# Patient Record
Sex: Female | Born: 1995 | Race: Black or African American | Hispanic: No | Marital: Single | Smoking: Current some day smoker
Health system: Southern US, Community
[De-identification: ages and names within clinical notes are randomized; demographics above are authoritative.]

## PROBLEM LIST (undated history)

## (undated) ENCOUNTER — Emergency Department (HOSPITAL_BASED_OUTPATIENT_CLINIC_OR_DEPARTMENT_OTHER): Admission: EM | Payer: Self-pay | Source: Home / Self Care

## (undated) DIAGNOSIS — J45909 Unspecified asthma, uncomplicated: Secondary | ICD-10-CM

---
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
## 2015-06-04 ENCOUNTER — Emergency Department (HOSPITAL_COMMUNITY)
Admission: EM | Admit: 2015-06-04 | Discharge: 2015-06-04 | Disposition: A | Discharge status: Home / Self Care | Payer: Medicaid Other | Attending: Emergency Medicine | Admitting: Emergency Medicine

## 2015-06-04 ENCOUNTER — Encounter (HOSPITAL_COMMUNITY): Payer: Self-pay | Admitting: General Practice

## 2015-06-04 DIAGNOSIS — M545 Low back pain: Secondary | ICD-10-CM | POA: Insufficient documentation

## 2015-06-04 DIAGNOSIS — Z3202 Encounter for pregnancy test, result negative: Secondary | ICD-10-CM | POA: Insufficient documentation

## 2015-06-04 LAB — COMPREHENSIVE METABOLIC PANEL
ALBUMIN: 3.6 g/dL (ref 3.5–5.0)
ALK PHOS: 60 U/L (ref 38–126)
ALT: 12 U/L — ABNORMAL LOW (ref 14–54)
AST: 19 U/L (ref 15–41)
Anion gap: 11 (ref 5–15)
BILIRUBIN TOTAL: 0.5 mg/dL (ref 0.3–1.2)
BUN: 7 mg/dL (ref 6–20)
CALCIUM: 9.3 mg/dL (ref 8.9–10.3)
CO2: 24 mmol/L (ref 22–32)
Chloride: 104 mmol/L (ref 101–111)
Creatinine, Ser: 0.69 mg/dL (ref 0.44–1.00)
GFR calc Af Amer: >60 mL/min (ref >60)
GFR calc non Af Amer: >60 mL/min (ref >60)
Glucose, Bld: 119 mg/dL — ABNORMAL HIGH (ref 65–99)
POTASSIUM: 3.6 mmol/L (ref 3.5–5.1)
Sodium: 139 mmol/L (ref 135–145)
TOTAL PROTEIN: 7.3 g/dL (ref 6.5–8.1)

## 2015-06-04 LAB — CBC WITH DIFFERENTIAL/PLATELET
BASOS ABS: 0 10*3/uL (ref 0.0–0.1)
BASOS PCT: 1 %
Eosinophils Absolute: 0 10*3/uL (ref 0.0–0.7)
Eosinophils Relative: 1 %
HEMATOCRIT: 37.5 % (ref 36.0–46.0)
HEMOGLOBIN: 12 g/dL (ref 12.0–15.0)
LYMPHS PCT: 42 %
Lymphs Abs: 1.8 10*3/uL (ref 0.7–4.0)
MCH: 25.2 pg — ABNORMAL LOW (ref 26.0–34.0)
MCHC: 32 g/dL (ref 30.0–36.0)
MCV: 78.8 fL (ref 78.0–100.0)
MONOS PCT: 8 %
Monocytes Absolute: 0.3 10*3/uL (ref 0.1–1.0)
NEUTROS ABS: 2 10*3/uL (ref 1.7–7.7)
NEUTROS PCT: 48 %
Platelets: 341 10*3/uL (ref 150–400)
RBC: 4.76 MIL/uL (ref 3.87–5.11)
RDW: 14.7 % (ref 11.5–15.5)
WBC: 4.2 10*3/uL (ref 4.0–10.5)

## 2015-06-04 LAB — URINALYSIS, ROUTINE W REFLEX MICROSCOPIC
Bilirubin Urine: NEGATIVE
Glucose, UA: NEGATIVE mg/dL
Ketones, ur: NEGATIVE mg/dL
Leukocytes, UA: NEGATIVE
Nitrite: NEGATIVE
Protein, ur: NEGATIVE mg/dL
Specific Gravity, Urine: 1.019 (ref 1.005–1.030)
Urobilinogen, UA: 0.2 mg/dL (ref 0.0–1.0)
pH: 5.5 (ref 5.0–8.0)

## 2015-06-04 LAB — POC URINE PREG, ED: Preg Test, Ur: NEGATIVE

## 2015-06-04 LAB — URINE MICROSCOPIC-ADD ON

## 2015-06-04 MED ORDER — METHOCARBAMOL 500 MG PO TABS: 500.0000 mg | ORAL_TABLET | Freq: Two times a day (BID) | ORAL | Status: DC

## 2015-06-04 MED ORDER — IBUPROFEN 800 MG PO TABS: 800.0000 mg | ORAL_TABLET | Freq: Three times a day (TID) | ORAL | Status: DC

## 2015-06-04 NOTE — ED Provider Notes (Signed)
CSN: [id number]     Arrival date & time 06/04/15  1003 History   First MD Initiated Contact with Patient 06/04/15 1008     Chief Complaint  Patient presents with  . Back Pain     (Consider location/radiation/quality/duration/timing/severity/associated sxs/prior Treatment) HPI   Caitlyn Jordan is a 19 y.o. female, pt with no pertinent past medical history, presents with pain in her lower right back and right flank, starting 2 months ago. Pt rates it 7/10, sharp in nature, occasionally moves to her left lower back.  Pt adds that her breasts are tender and she has had swelling in her legs bilaterally during this time.  Pt LMP is now, but has been intermittent. Reports foul smelling urine, but denies dysuria, frequency, or urgency. Denies changes in bowel movements. Denies fever/chills, N/V/C/D, previous episodes of this nature, hematochezia, or any other pain or complaints. Pt has not taken anything for the pain. Denies any injuries.       History reviewed. No pertinent past medical history. History reviewed. No pertinent past surgical history. No family history on file. Social History  Substance Use Topics  . Smoking status: Never Smoker   . Smokeless tobacco: None  . Alcohol Use: No   OB History    No data available     Review of Systems  Constitutional: Negative for fever, chills, diaphoresis and unexpected weight change.  Respiratory: Negative for cough, chest tightness and shortness of breath.   Cardiovascular: Negative for chest pain, palpitations and leg swelling.  Gastrointestinal: Negative for nausea, vomiting, abdominal pain, diarrhea and constipation.  Genitourinary: Negative for dysuria, frequency, hematuria, flank pain, vaginal bleeding, vaginal discharge, difficulty urinating, vaginal pain and pelvic pain.  Musculoskeletal: Positive for back pain. Negative for joint swelling and arthralgias.  Skin: Negative for color change and pallor.  Neurological: Negative for  dizziness, syncope, weakness, light-headedness, numbness and headaches.  All other systems reviewed and are negative.     Allergies  Review of patient's allergies indicates no known allergies.  Home Medications   Prior to Admission medications   Medication Sig Start Date End Date Taking? Authorizing Provider  ibuprofen (ADVIL,MOTRIN) 800 MG tablet Take 1 tablet (800 mg total) by mouth 3 (three) times daily. 06/04/15   Shawn C Joy, PA-C  methocarbamol (ROBAXIN) 500 MG tablet Take 1 tablet (500 mg total) by mouth 2 (two) times daily. 06/04/15   Shawn C Joy, PA-C   BP 110/42 mmHg  Pulse 71  Temp(Src) 98.5 F (36.9 C) (Oral)  Resp 18  Ht 5\' 3"  (1.6 m)  Wt 164 lb (74.39 kg)  BMI 29.06 kg/m2  SpO2 100%  LMP 06/03/2015 Physical Exam  Constitutional: She appears well-developed and well-nourished. No distress.  HENT:  Head: Normocephalic and atraumatic.  Eyes: Conjunctivae are normal. Pupils are equal, round, and reactive to light.  Cardiovascular: Normal rate, regular rhythm and normal heart sounds.   Pulmonary/Chest: Effort normal and breath sounds normal. No respiratory distress.  Abdominal: Soft. Bowel sounds are normal.  Mild CVA tenderness on right. Abdomen without tenderness or guarding.   Musculoskeletal: She exhibits no edema or tenderness.  Neurological: She is alert.  Skin: Skin is warm and dry. She is not diaphoretic.  Nursing note and vitals reviewed.   ED Course  Procedures (including critical care time) Labs Review Labs Reviewed  URINALYSIS, ROUTINE W REFLEX MICROSCOPIC (NOT AT [facility]) - Abnormal; Notable for the following:    Hgb urine dipstick SMALL (*)    All other  components within normal limits  URINE MICROSCOPIC-ADD ON - Abnormal; Notable for the following:    Bacteria, UA FEW (*)    All other components within normal limits  CBC WITH DIFFERENTIAL/PLATELET - Abnormal; Notable for the following:    MCH 25.2 (*)    All other components within normal limits   COMPREHENSIVE METABOLIC PANEL  POC URINE PREG, ED    Imaging Review No results found. I have personally reviewed and evaluated these images and lab results as part of my medical decision-making.   EKG Interpretation None      MDM   Final diagnoses:  Right low back pain, with sciatica presence unspecified    Caitlyn Jordan presents with lower back pain for the past two months.   UA results non-specific. Pregnancy test is negative. Pt is comfortably sitting up in bed, playing with her phone. Suspect MSK back pain. While waiting for lab results, pt confides in nurse that she really just wanted a pregnancy test and doesn't need anything else. Will discharge pt and have her follow up with PCP.   Anselm Pancoast, PA-C 06/04/15 1246  Blake Divine, MD 06/05/15 [phone]

## 2015-06-04 NOTE — Discharge Instructions (Signed)
You have been seen today for back pain. Your lab tests showed no abnormalities. Return to ED should symptoms worsen.  Take the prescribed medication and follow up with your PCP as needed.    Emergency Department Resource Guide 1) Find a Doctor and Pay Out of Pocket Although you won't have to find out who is covered by your insurance plan, it is a good idea to ask around and get recommendations. You will then need to call the office and see if the doctor you have chosen will accept you as a new patient and what types of options they offer for patients who are self-pay. Some doctors offer discounts or will set up payment plans for their patients who do not have insurance, but you will need to ask so you aren't surprised when you get to your appointment.  2) Contact Your Local Health Department Not all health departments have doctors that can see patients for sick visits, but many do, so it is worth a call to see if yours does. If you don't know where your local health department is, you can check in your phone book. The CDC also has a tool to help you locate your state's health department, and many state websites also have listings of all of their local health departments.  3) Find a Walk-in Clinic If your illness is not likely to be very severe or complicated, you may want to try a walk in clinic. These are popping up all over the country in pharmacies, drugstores, and shopping centers. They're usually staffed by nurse practitioners or physician assistants that have been trained to treat common illnesses and complaints. They're usually fairly quick and inexpensive. However, if you have serious medical issues or chronic medical problems, these are probably not your best option.  No Primary Care Doctor: - Call Health Connect at  [phone] - they can help you locate a primary care doctor that  accepts your insurance, provides certain services, etc. - Physician Referral Service- [phone]  Chronic  Pain Problems: Organization         Address  Phone   Notes  [facility]  [phone]) [phone] Patients need to be referred by their primary care doctor.   Medication Assistance: Organization         Address  Phone   Notes  [facility] [address] [place], [place] [address] [phone]) [phone] --Must be a resident of [facility] [place] -- Must have NO insurance coverage whatsoever (no Medicaid/ Medicare, etc.) -- The pt. MUST have a primary care doctor that directs their care regularly and follows them in the community   MedAssist  [phone]   [employer]  [phone]    Agencies that provide inexpensive medical care: Organization         Address  Phone   Notes  [facility]  [phone]   [facility]    [phone]) [phone]   [facility] [address] [place], [place] [address] [phone](336) [phone]   [facility] [address], [place] [phone]   [facility]anned Parenthood    [phone]   [facility]    [phone]) [phone]   [facility]  [address], [place] Phone:  [phone], Fax:  [phone] Hours of Operation:  9 am - 6 pm, M-F.  Also accepts Medicaid/Medicare and self-pay.  [facility]  [address], [place] Phone: [phone], Fax: [phone]. Hours of Operation:  8:30 am - 5:30 pm, M-F.  Also accepts Medicaid and self-pay.  [facility] [place] [address], [place] Phone: [phone]   [place], [place], [place] [phone], Ext. [phone] Mondays & Thursdays: 7-9 AM.  First 15 patients are seen on a first come, first serve basis.    [place] Providers:  Organization         Address  Phone   Notes  [facility] [address], [place] [phone] Also  accepts self-pay patients.  [facility] [address], [place]  [phone]   [place], [address], [place] [phone]   [facility] [address], [place] [phone]   Lucianne Lei [address], [place]   [phone] Only accepts [place] Access [place] patients after they have their name applied to their card.   Self-Pay (no insurance) in [facility]:  Organization         Address  Phone   Notes  Sickle Cell Patients, [facility] [place] [phone]   [facility] [place] [phone]   [facility] [place]  [place], [place], [place] [phone]   [facility]/Dr. Osei-Bonsu  [address] [place] or [address], [place] [phone] Phone number for both [place] and [place] locations is the same.  [facility] [address], [place] [phone]   [facility] [address], [place] or [address] [phone]   [facility] [address] [place] [phone], phone; [phone], fax Sees patients 1st and 3rd Saturday of every month.  Must not qualify for public or private insurance (i.e. Medicaid, Medicare, [place] Health Choice, Veterans' Benefits)  Household income should be no more than 200% of the poverty level The clinic cannot treat you if you are pregnant or think you are pregnant  Sexually transmitted diseases are not treated at the clinic.    Dental Care: Organization         Address  Phone  Notes  [facility] Department of [facility] [phone] Accepts children up to age 78 who are enrolled in [place] or [place]; pregnant  women with a Medicaid card; and children who have applied for Medicaid or [place] Health Choice, but were declined, whose parents can pay a reduced fee at time of service.  [facility] Department of [facility]  [address], [place] [phone] Accepts children up to age 58 who are enrolled in [place] or [place]; pregnant women with a Medicaid card; and children who have applied for Medicaid or [place] Health Choice, but were declined, whose parents can pay a reduced fee at time of service.  [facility] Access PROGRAM  [place] [phone] Patients are seen by appointment only. Walk-ins are not accepted. [place] will see patients 67 years of age and older. Monday - Tuesday (8am-5pm) Most Wednesdays (8:30-5pm) $30 per visit, cash only  [facility]  [address], [facility] [phone] Patients are seen by appointment only. Walk-ins are not accepted. [place] will see patients 29 years of age and older. One Wednesday Evening (Monthly: Volunteer Based).  $30 per visit, cash only  [place]  [phone] for adults; Children under age 108, call Graduate Pediatric Dentistry at [phone]. Children aged 52-14, please call [phone] to request a pediatric application.  Dental services are provided in all areas of dental care including fillings, crowns and bridges, complete and partial dentures, implants, gum treatment, root canals, and extractions. Preventive care is also provided. Treatment is provided to both adults and children. Patients are selected via a lottery and there is often a waiting list.   [facility] [address] [place]  [phone] [email or web address]   [facility] [address] [place], [place] [phone], Ext. [phone] Second and Fourth Thursday of each month, opens at 6:30 AM; Clinic ends at 9 AM.  Patients are  seen on a first-come first-served basis, and a limited number are seen during each clinic.   [facility]  [address] Hillard Danker [place], [place] [phone]   Eligibility Requirements You must have lived in [place], [place], or [place] counties for at least the last three months.   You cannot be eligible for state or federal sponsored [employer], including [employer], [place], or [employer].   You generally cannot be eligible for healthcare insurance through your employer.    How to apply: Eligibility screenings are held every Tuesday and Wednesday afternoon from 1:00 pm until 4:00 pm. You do not need an appointment for the interview!  [facility] [address], [place], [place]   [place]  [place] Department  [place]  [phone]    Behavioral Health Resources in the Community: Intensive Outpatient Programs Organization         Address  Phone  Notes  [place]. [address], [place], [place] [phone]   [facility] Outpatient [address], [place], [place]   ADS: Alcohol & Drug Svcs [address] [place], [place]   [place] [address]  [place], [place] or [phone]   Substance Abuse Resources Organization         Address  Phone  Notes  Alcohol and Drug Services  [phone]   [place]  [phone]   [facility] [place]   Chinita Pester  [phone]   Residential & Outpatient Substance Abuse Program  [phone]   Psychological Services Organization         Address  Phone  Notes  [facility] [place]  [place]  [phone]   [place] [address], [place] [phone] or [phone]    Mobile Crisis  Teams Organization         Address  Phone  Notes  [facility], Mobile Crisis Care Unit  [phone]   Assertive Psychotherapeutic Services  [address] [place], [place]   [place] Levels [address], [place] [phone]    Self-Help/Support Groups Organization         Address  Phone             Notes  [facility] of [place] - variety of support groups  [place] Call for more information  Narcotics Anonymous (NA), [facility] [address], [employer] [place]  2 meetings at this location   Special educational needs teacher         Address  Phone  Notes  [facility]AP Residential Treatment [place],    [place]  [phone]   [facility]  [address] [place] [address]17510, [place], [place]   [place], [place] [phone] Admissions: 8am-3pm M-F  Incentives Substance [place] [address]    [place], [place] [phone]   [facility] [address] [place], [place], [place]   [facility] [address],  [place], [place]   [facility]ight Programs - Intensive Outpatient [address], [place], [place]   [facility] ([place].) [place].,  [place], [place] [phone] or [phone]   [facility] Services (RTS) [address], [place], [place] Accepts Medicaid  [facility] [place] [address],  [place] [phone] Substance Abuse/Addiction Treatment   [facility] Organization         Address  Phone  Notes  [facility]  [phone]   Domenic Schwab, PhD [address] Arlis Porta [place], [place]   [phone] or [phone]   [place], [place] [phone]   [facility] [address] [place], [place] [phone]  Insurance/Medicaid/sponsorship through [facility] and Families [address]                                    [place], [place] [phone] [place] [address] [place], [place] [phone]    Dr. Adele Schilder  [phone]   [facility] Dept. 1) [address] [place] 2) [place] 3)  [place] [address], [place] [phone]  [phone]   [place] [phone] or [phone] (After Hours)

## 2015-06-04 NOTE — ED Notes (Signed)
Pt presents to the ED with complaints of right sided back pain that started two months ago. Pt reporting a malodorus smell to urine. Pt denies any vaginal discharge, burning or frequency of urination. Pt reporting some discharge and tenderness of her breast bilaterally.

## 2015-09-19 ENCOUNTER — Encounter (HOSPITAL_BASED_OUTPATIENT_CLINIC_OR_DEPARTMENT_OTHER): Payer: Self-pay | Admitting: Emergency Medicine

## 2015-09-19 ENCOUNTER — Emergency Department (HOSPITAL_BASED_OUTPATIENT_CLINIC_OR_DEPARTMENT_OTHER)
Admission: EM | Admit: 2015-09-19 | Discharge: 2015-09-20 | Disposition: A | Discharge status: Home / Self Care | Payer: Medicaid Other | Attending: Emergency Medicine | Admitting: Emergency Medicine

## 2015-09-19 DIAGNOSIS — Z791 Long term (current) use of non-steroidal anti-inflammatories (NSAID): Secondary | ICD-10-CM | POA: Insufficient documentation

## 2015-09-19 DIAGNOSIS — N926 Irregular menstruation, unspecified: Secondary | ICD-10-CM | POA: Insufficient documentation

## 2015-09-19 DIAGNOSIS — Z3202 Encounter for pregnancy test, result negative: Secondary | ICD-10-CM | POA: Insufficient documentation

## 2015-09-19 DIAGNOSIS — Z79899 Other long term (current) drug therapy: Secondary | ICD-10-CM | POA: Insufficient documentation

## 2015-09-19 DIAGNOSIS — N946 Dysmenorrhea, unspecified: Secondary | ICD-10-CM | POA: Insufficient documentation

## 2015-09-19 DIAGNOSIS — N910 Primary amenorrhea: Secondary | ICD-10-CM

## 2015-09-19 NOTE — ED Notes (Signed)
Vaginal bleeding with not pain until 1pm today. Pt states was supposed to start period on Feb 4th. She states she doesn't know if she is pregnant.

## 2015-09-19 NOTE — ED Provider Notes (Signed)
CSN: [id number]     Arrival date & time 09/19/15  2333 History  By signing my name below, I, Arianna Nassar, attest that this documentation has been prepared under the direction and in the presence of Laurence Spates, MD. Electronically Signed: Octavia Heir, ED Scribe. 09/19/2015. 11:57 PM.    Chief Complaint  Patient presents with  . Vaginal Bleeding      The history is provided by the patient. No language interpreter was used.   HPI Comments: Caitlyn Jordan is a 20 y.o. female who presents to the Emergency Department complaining of constant, gradual worsening heavy vaginal bleeding onset this morning with associated intermittent lower abdominal cramping that started around 1 pm. Pt was supposed to have her period exactly on the 4th of February. She states that her periods are typically very light but the vaginal bleeding she is having is heavier than normal. Pt is not on on birth control and is sexually active without using protection. She is unsure if she is pregnant. Last period was on January 4th. Denies fever, vomiting, diarrhea, dysuria, burning while urinating, cough, cold, fever, and vaginal discharge.  History reviewed. No pertinent past medical history. History reviewed. No pertinent past surgical history. No family history on file. Social History  Substance Use Topics  . Smoking status: Never Smoker   . Smokeless tobacco: None  . Alcohol Use: No   OB History    No data available     Review of Systems  10 Systems reviewed and are negative for acute change except as noted in the HPI.   Allergies  Review of patient's allergies indicates no known allergies.  Home Medications   Prior to Admission medications   Medication Sig Start Date End Date Taking? Authorizing Provider  ibuprofen (ADVIL,MOTRIN) 800 MG tablet Take 1 tablet (800 mg total) by mouth 3 (three) times daily. 06/04/15   Shawn C Joy, PA-C  methocarbamol (ROBAXIN) 500 MG tablet Take 1 tablet (500 mg  total) by mouth 2 (two) times daily. 06/04/15   Anselm Pancoast, PA-C   Triage vitals: BP 136/79 mmHg  Pulse 84  Temp(Src) 98.4 F (36.9 C) (Oral)  Resp 18  Ht  (1.6 m)  Wt 160 lb (72.576 kg)  BMI 28.35 kg/m2  SpO2 100% Physical Exam  Constitutional: She is oriented to person, place, and time. She appears well-developed and well-nourished. No distress.  HENT:  Head: Normocephalic and atraumatic.  Moist mucous membranes  Eyes: Conjunctivae are normal. Pupils are equal, round, and reactive to light.  Neck: Neck supple.  Cardiovascular: Normal rate, regular rhythm and normal heart sounds.   No murmur heard. Pulmonary/Chest: Effort normal and breath sounds normal.  Abdominal: Soft. Bowel sounds are normal. She exhibits no distension. There is tenderness. There is no rebound and no guarding.  Mild suprapubic tenderness to palpation  Genitourinary: Vagina normal.  Moderate amount of blood in vaginal vault, no cervical motion or adnexal tenderness, no suprapubic tenderness on bimanual exam  Musculoskeletal: She exhibits no edema.  Neurological: She is alert and oriented to person, place, and time.  Fluent speech  Skin: Skin is warm and dry.  Psychiatric: She has a normal mood and affect. Judgment normal.  Nursing note and vitals reviewed. Chaperone was present during exam.   ED Course  Procedures  DIAGNOSTIC STUDIES: Oxygen Saturation is 100% on RA, normal by my interpretation.  COORDINATION OF CARE:  11:55 PM Discussed treatment plan which includes pelvic exam and labs with pt  at bedside and pt agreed to plan.  Labs Review Labs Reviewed  COMPREHENSIVE METABOLIC PANEL - Abnormal; Notable for the following:    Glucose, Bld 112 (*)    ALT 13 (*)    All other components within normal limits  CBC WITH DIFFERENTIAL/PLATELET - Abnormal; Notable for the following:    MCH 25.4 (*)    Platelets 419 (*)    All other components within normal limits  HCG, QUANTITATIVE, PREGNANCY   ABO/RH     MDM   Final diagnoses:  Delayed menstruation  Menstrual cramps   Patient presents with vaginal bleeding several days after her expected date of menstrual period associated with suprapubic cramping that became worse today. Mild improvement with hot shower. Patient was well-appearing with normal vital signs on exam. Mild suprapubic tenderness on abdominal exam but no tenderness on bimanual exam. Obtained above lab work which shows negative beta hCG. Symptoms consistent with dysmenorrhea given that the patient has had no vomiting, diarrhea, fevers, or other complaints. Discussed supportive care and emphasized importance of follow-up at [facility] clinic for birth control options as patient is at high risk for unplanned pregnancy. Patient voiced understanding and was discharged in satisfactory condition.  I personally performed the services described in this documentation, which was scribed in my presence. The recorded information has been reviewed and is accurate.   Laurence Spates, MD 09/20/15 [phone]

## 2015-09-20 LAB — COMPREHENSIVE METABOLIC PANEL
ALBUMIN: 4.1 g/dL (ref 3.5–5.0)
ALK PHOS: 58 U/L (ref 38–126)
ALT: 13 U/L — ABNORMAL LOW (ref 14–54)
ANION GAP: 10 (ref 5–15)
AST: 17 U/L (ref 15–41)
BUN: 10 mg/dL (ref 6–20)
CALCIUM: 9 mg/dL (ref 8.9–10.3)
CHLORIDE: 105 mmol/L (ref 101–111)
CO2: 23 mmol/L (ref 22–32)
Creatinine, Ser: 0.71 mg/dL (ref 0.44–1.00)
GFR calc non Af Amer: >60 mL/min (ref >60)
GLUCOSE: 112 mg/dL — AB (ref 65–99)
POTASSIUM: 3.5 mmol/L (ref 3.5–5.1)
SODIUM: 138 mmol/L (ref 135–145)
Total Bilirubin: 0.5 mg/dL (ref 0.3–1.2)
Total Protein: 8 g/dL (ref 6.5–8.1)

## 2015-09-20 LAB — CBC WITH DIFFERENTIAL/PLATELET
BASOS PCT: 1 %
Basophils Absolute: 0 10*3/uL (ref 0.0–0.1)
Eosinophils Absolute: 0.1 10*3/uL (ref 0.0–0.7)
Eosinophils Relative: 2 %
HEMATOCRIT: 38.2 % (ref 36.0–46.0)
HEMOGLOBIN: 12.1 g/dL (ref 12.0–15.0)
LYMPHS PCT: 47 %
Lymphs Abs: 3.3 10*3/uL (ref 0.7–4.0)
MCH: 25.4 pg — ABNORMAL LOW (ref 26.0–34.0)
MCHC: 31.7 g/dL (ref 30.0–36.0)
MCV: 80.3 fL (ref 78.0–100.0)
MONO ABS: 0.5 10*3/uL (ref 0.1–1.0)
MONOS PCT: 7 %
NEUTROS ABS: 2.9 10*3/uL (ref 1.7–7.7)
NEUTROS PCT: 43 %
Platelets: 419 10*3/uL — ABNORMAL HIGH (ref 150–400)
RBC: 4.76 MIL/uL (ref 3.87–5.11)
RDW: 14.3 % (ref 11.5–15.5)
WBC: 6.9 10*3/uL (ref 4.0–10.5)

## 2015-09-20 LAB — HCG, QUANTITATIVE, PREGNANCY: hCG, Beta Chain, Quant, S: <1 m[IU]/mL (ref <5)

## 2015-09-20 LAB — ABO/RH: ABO/RH(D): A POS

## 2015-09-20 NOTE — Discharge Instructions (Signed)

## 2015-09-22 LAB — GC/CHLAMYDIA PROBE AMP (~~LOC~~) NOT AT ARMC
Chlamydia: NEGATIVE
Neisseria Gonorrhea: NEGATIVE

## 2016-08-30 ENCOUNTER — Emergency Department (HOSPITAL_BASED_OUTPATIENT_CLINIC_OR_DEPARTMENT_OTHER)
Admission: EM | Admit: 2016-08-30 | Discharge: 2016-08-30 | Disposition: A | Discharge status: Home / Self Care | Payer: Self-pay | Attending: Emergency Medicine | Admitting: Emergency Medicine

## 2016-08-30 ENCOUNTER — Encounter (HOSPITAL_BASED_OUTPATIENT_CLINIC_OR_DEPARTMENT_OTHER): Payer: Self-pay | Admitting: *Deleted

## 2016-08-30 DIAGNOSIS — R110 Nausea: Secondary | ICD-10-CM | POA: Insufficient documentation

## 2016-08-30 DIAGNOSIS — N926 Irregular menstruation, unspecified: Secondary | ICD-10-CM

## 2016-08-30 DIAGNOSIS — N912 Amenorrhea, unspecified: Secondary | ICD-10-CM | POA: Insufficient documentation

## 2016-08-30 LAB — PREGNANCY, URINE: Preg Test, Ur: NEGATIVE

## 2016-08-30 NOTE — ED Triage Notes (Signed)
Pt states that she had 2 positive and 1 negative pregnancy test and is here to confirm if she is pregnant. Denies any pain or complaints. Only here to confirm a pregnancy.

## 2016-08-30 NOTE — ED Provider Notes (Signed)
TIME SEEN: 6:25 AM  CHIEF COMPLAINT: "I'm here for pregnancy test"  HPI: Patient is a 3 female with no significant past pedicle history who has never been pregnant who presents to the emergency department requesting a pregnancy test. States her last menstrual period was the beginning of December. She reports she has not had a period in January. States that she took a pregnancy test at home that was positive and then another test that was negative. States she is here for confirmation. Denies fevers, chills, vaginal bleeding or discharge, abdominal pain, dysuria or hematuria. Has had nausea but no vomiting. No diarrhea. Sexually active with one female partner. States she has had some bilateral breast tenderness without nipple discharge.  ROS: See HPI Constitutional: no fever  Eyes: no drainage  ENT: no runny nose   Cardiovascular:  no chest pain  Resp: no SOB  GI: no vomiting GU: no dysuria Integumentary: no rash  Allergy: no hives  Musculoskeletal: no leg swelling  Neurological: no slurred speech ROS otherwise negative  PAST MEDICAL HISTORY/PAST SURGICAL HISTORY:  No past medical history on file.  MEDICATIONS:  Prior to Admission medications   Medication Sig Start Date End Date Taking? Authorizing Provider  ibuprofen (ADVIL,MOTRIN) 800 MG tablet Take 1 tablet (800 mg total) by mouth 3 (three) times daily. 06/04/15   Shawn C Joy, PA-C  methocarbamol (ROBAXIN) 500 MG tablet Take 1 tablet (500 mg total) by mouth 2 (two) times daily. 06/04/15   Shawn C Joy, PA-C    ALLERGIES:  No Known Allergies  SOCIAL HISTORY:  Social History  Substance Use Topics  . Smoking status: Never Smoker  . Smokeless tobacco: Not on file  . Alcohol use No    FAMILY HISTORY: No family history on file.  EXAM: BP 137/81 (BP Location: Right Arm)   Pulse 92   Temp 98.5 F (36.9 C) (Oral)   Resp 16   SpO2 100%  CONSTITUTIONAL: Alert and oriented and responds appropriately to questions. Well-appearing;  well-nourished, Smiling, looks well-hydrated, afebrile, nontoxic, in no distress HEAD: Normocephalic EYES: Conjunctivae clear, PERRL, EOMI ENT: normal nose; no rhinorrhea; moist mucous membranes NECK: Supple, no meningismus, no nuchal rigidity, no LAD  CARD: RRR; S1 and S2 appreciated; no murmurs, no clicks, no rubs, no gallops RESP: Normal chest excursion without splinting or tachypnea; breath sounds clear and equal bilaterally; no wheezes, no rhonchi, no rales, no hypoxia or respiratory distress, speaking full sentences ABD/GI: Normal bowel sounds; non-distended; soft, non-tender, no rebound, no guarding, no peritoneal signs, no hepatosplenomegaly BACK:  The back appears normal and is non-tender to palpation, there is no CVA tenderness EXT: Normal ROM in all joints; non-tender to palpation; no edema; normal capillary refill; no cyanosis, no calf tenderness or swelling    SKIN: Normal color for age and race; warm; no rash NEURO: Moves all extremities equally, ambulates with normal gait, normal speech PSYCH: The patient's mood and manner are appropriate. Grooming and personal hygiene are appropriate.  MEDICAL DECISION MAKING: Patient here requesting pregnancy test. No emergent complaints. Exam benign. Urine pregnancy test is negative here. I have advised her that if she does not start her menstrual cycle in one week she should recheck a pregnancy test at home. Discussed return precautions with patient. Given her outpatient PCP and OB/GYN follow-up. She is comfortable with this plan.   At this time, I do not feel there is any life-threatening condition present. I have reviewed and discussed all results (EKG, imaging, lab, urine as appropriate)  and exam findings with patient/family. I have reviewed nursing notes and appropriate previous records.  I feel the patient is safe to be discharged home without further emergent workup and can continue workup as an outpatient as needed. Discussed usual and  customary return precautions. Patient/family verbalize understanding and are comfortable with this plan.  Outpatient follow-up has been provided. All questions have been answered.        Layla MawKristen N Ward, DO 08/30/16 0710

## 2016-08-30 NOTE — Discharge Instructions (Addendum)
I recommend you take Tylenol 1000 mg every 6 hours as needed for pain. Please avoid NSAIDs such as ibuprofen, aspirin, Aleve. I recommend you start prenatal vitamins.  Please recheck a pregnancy test in one week.  Your test here was negative but this could mean you are not pregnant but also could be too early to detect pregnancy.    [facility] [email or web address] [address] [place], [place] [phone]    [facility] [email or web address] [address] [place], [place] [phone]    [facility] [address] [place], [place] [phone]   [facility] [email or web address] [address] [place], [place] [phone]   [facility] [email or web address] [address] [place], [place] [phone]   [email or web address] [address] [place], [place] [phone]     To find a primary care or specialty doctor please call [phone] or [phone] to access "[facility] Find a Doctor Service."  You may also go on the [facility] website at [email or web address]  There are also multiple [facility], [facility], [facility] and [facility] practices throughout [place] that are frequently accepting new patients. You may find a clinic that is close to your home and contact them.  [facility] -  [address] [place] [phone]   [facility] -  [address] [place] [address] [phone]   [facility] [phone]  [place] [phone]

## 2016-10-28 ENCOUNTER — Emergency Department (HOSPITAL_BASED_OUTPATIENT_CLINIC_OR_DEPARTMENT_OTHER): Payer: Self-pay

## 2016-10-28 ENCOUNTER — Encounter (HOSPITAL_BASED_OUTPATIENT_CLINIC_OR_DEPARTMENT_OTHER): Payer: Self-pay | Admitting: Emergency Medicine

## 2016-10-28 ENCOUNTER — Emergency Department (HOSPITAL_BASED_OUTPATIENT_CLINIC_OR_DEPARTMENT_OTHER)
Admission: EM | Admit: 2016-10-28 | Discharge: 2016-10-28 | Disposition: A | Discharge status: Home / Self Care | Payer: Self-pay | Attending: Emergency Medicine | Admitting: Emergency Medicine

## 2016-10-28 DIAGNOSIS — J069 Acute upper respiratory infection, unspecified: Secondary | ICD-10-CM

## 2016-10-28 DIAGNOSIS — B9789 Other viral agents as the cause of diseases classified elsewhere: Secondary | ICD-10-CM

## 2016-10-28 DIAGNOSIS — J40 Bronchitis, not specified as acute or chronic: Secondary | ICD-10-CM

## 2016-10-28 MED ORDER — BENZONATATE 100 MG PO CAPS: 100.0000 mg | ORAL_CAPSULE | Freq: Three times a day (TID) | ORAL | 0 refills | Status: AC | PRN

## 2016-10-28 MED ORDER — BENZONATATE 100 MG PO CAPS
200.0000 mg | ORAL_CAPSULE | Freq: Once | ORAL | Status: AC
  Administered 2016-10-28: 200 mg via ORAL
  Filled 2016-10-28: qty 2

## 2016-10-28 MED ORDER — IBUPROFEN 400 MG PO TABS
600.0000 mg | ORAL_TABLET | Freq: Once | ORAL | Status: AC
  Administered 2016-10-28: 600 mg via ORAL
  Filled 2016-10-28: qty 1

## 2016-10-28 NOTE — ED Provider Notes (Signed)
[facility] Provider Note   CSN: [id number] Arrival date & time: 10/28/16  0228     History   Chief Complaint Chief Complaint  Patient presents with  . Cough    HPI Caitlyn Jordan is a 21 y.o. female no sig PMH, here with persistent cough x 1 month.  She states she also has congestion and rhinorrhea and she seems to be coughing this up. Her symptoms are worse at night. She denies fevers or sick contacts. She is concerned she may have a bronchitis. She has not tried anything at home to help with her symptoms. There are no further complaints.  10 Systems reviewed and are negative for acute change except as noted in the HPI.    HPI  History reviewed. No pertinent past medical history.  There are no active problems to display for this patient.   History reviewed. No pertinent surgical history.  OB History    No data available       Home Medications    Prior to Admission medications   Medication Sig Start Date End Date Taking? Authorizing Provider  benzonatate (TESSALON) 100 MG capsule Take 1 capsule (100 mg total) by mouth 3 (three) times daily as needed for cough. 10/28/16   Tomasita CrumbleAdeleke Kayleann Mccaffery, MD  ibuprofen (ADVIL,MOTRIN) 800 MG tablet Take 1 tablet (800 mg total) by mouth 3 (three) times daily. 06/04/15   Shawn C Joy, PA-C  methocarbamol (ROBAXIN) 500 MG tablet Take 1 tablet (500 mg total) by mouth 2 (two) times daily. 06/04/15   Anselm PancoastShawn C Joy, PA-C    Family History No family history on file.  Social History Social History  Substance Use Topics  . Smoking status: Never Smoker  . Smokeless tobacco: Never Used  . Alcohol use No     Allergies   Patient has no known allergies.   Review of Systems Review of Systems   Physical Exam Updated Vital Signs BP 117/84 (BP Location: Right Arm)   Pulse (!) 108   Temp 98.9 F (37.2 C) (Oral)   Resp 20   Ht 5\' 2"  (1.575 m)   Wt 160 lb (72.6 kg)   LMP 10/06/2016 (Exact Date)   SpO2 100%   BMI 29.26 kg/m    Physical Exam  Constitutional: She is oriented to person, place, and time. She appears well-developed and well-nourished. No distress.  HENT:  Head: Normocephalic and atraumatic.  Nose: Nose normal.  Mouth/Throat: Oropharynx is clear and moist. No oropharyngeal exudate.  Eyes: Conjunctivae and EOM are normal. Pupils are equal, round, and reactive to light. No scleral icterus.  Neck: Normal range of motion. Neck supple. No JVD present. No tracheal deviation present. No thyromegaly present.  Cardiovascular: Normal rate, regular rhythm and normal heart sounds.  Exam reveals no gallop and no friction rub.   No murmur heard. Pulmonary/Chest: Effort normal and breath sounds normal. No respiratory distress. She has no wheezes. She exhibits no tenderness.  Abdominal: Soft. Bowel sounds are normal. She exhibits no distension and no mass. There is no tenderness. There is no rebound and no guarding.  Musculoskeletal: Normal range of motion. She exhibits no edema or tenderness.  Lymphadenopathy:    She has no cervical adenopathy.  Neurological: She is alert and oriented to person, place, and time. No cranial nerve deficit. She exhibits normal muscle tone.  Skin: Skin is warm and dry. No rash noted. No erythema. No pallor.  Nursing note and vitals reviewed.    ED Treatments /  Results  Labs (all labs ordered are listed, but only abnormal results are displayed) Labs Reviewed - No data to display  EKG  EKG Interpretation None       Radiology Dg Chest 2 View  Result Date: 10/28/2016 CLINICAL DATA:  21 year old female with productive cough. EXAM: CHEST  2 VIEW COMPARISON:  Chest radiograph dated 07/12/2011 FINDINGS: The heart size and mediastinal contours are within normal limits. Both lungs are clear. The visualized skeletal structures are unremarkable. IMPRESSION: No active cardiopulmonary disease. Electronically Signed   By: Elgie Collard M.D.   On: 10/28/2016 03:02     Procedures Procedures (including critical care time)  Medications Ordered in ED Medications  ibuprofen (ADVIL,MOTRIN) tablet 600 mg (600 mg Oral Given 10/28/16 0246)  benzonatate (TESSALON) capsule 200 mg (200 mg Oral Given 10/28/16 0246)     Initial Impression / Assessment and Plan / ED Course  I have reviewed the triage vital signs and the nursing notes.  Pertinent labs & imaging results that were available during my care of the patient were reviewed by me and considered in my medical decision making (see chart for details).     Patient presents to the emergency department for persistent cough. Chest x-rays negative for pneumonia. She was given ibuprofen and Tessalon which did relieve her cough although she states it made her slightly lightheaded. Vital signs remained stable, tachycardia resolved without any acute intervention. She'll be given Tessalon to take at home as needed. Also encouraged her to try home remedies for such as hot tea, limit, honey. She wishes good understanding, her primary care follow-up advised in 3 days. She appears well in acute distress, vital signs were within her normal limits and she is safe for discharge.     Final Clinical Impressions(s) / ED Diagnoses   Final diagnoses:  Bronchitis  Viral URI with cough    New Prescriptions New Prescriptions   BENZONATATE (TESSALON) 100 MG CAPSULE    Take 1 capsule (100 mg total) by mouth 3 (three) times daily as needed for cough.     Tomasita Crumble, MD 10/28/16 [phone]

## 2016-10-28 NOTE — ED Notes (Signed)
ED Provider at bedside. 

## 2016-10-28 NOTE — ED Triage Notes (Signed)
Cough x1 month.  Sputum green.  Pt sts cough is worse at night.

## 2017-05-05 ENCOUNTER — Emergency Department (HOSPITAL_BASED_OUTPATIENT_CLINIC_OR_DEPARTMENT_OTHER)
Admission: EM | Admit: 2017-05-05 | Discharge: 2017-05-05 | Disposition: A | Discharge status: Home / Self Care | Payer: No Typology Code available for payment source | Attending: Emergency Medicine | Admitting: Emergency Medicine

## 2017-05-05 ENCOUNTER — Emergency Department (HOSPITAL_BASED_OUTPATIENT_CLINIC_OR_DEPARTMENT_OTHER): Payer: No Typology Code available for payment source

## 2017-05-05 ENCOUNTER — Encounter (HOSPITAL_BASED_OUTPATIENT_CLINIC_OR_DEPARTMENT_OTHER): Payer: Self-pay | Admitting: *Deleted

## 2017-05-05 DIAGNOSIS — Y929 Unspecified place or not applicable: Secondary | ICD-10-CM | POA: Diagnosis not present

## 2017-05-05 DIAGNOSIS — Y939 Activity, unspecified: Secondary | ICD-10-CM | POA: Insufficient documentation

## 2017-05-05 DIAGNOSIS — Y999 Unspecified external cause status: Secondary | ICD-10-CM | POA: Diagnosis not present

## 2017-05-05 DIAGNOSIS — Z79899 Other long term (current) drug therapy: Secondary | ICD-10-CM | POA: Insufficient documentation

## 2017-05-05 DIAGNOSIS — S20212A Contusion of left front wall of thorax, initial encounter: Secondary | ICD-10-CM | POA: Diagnosis not present

## 2017-05-05 DIAGNOSIS — M25512 Pain in left shoulder: Secondary | ICD-10-CM | POA: Diagnosis present

## 2017-05-05 MED ORDER — ACETAMINOPHEN 500 MG PO TABS
1000.0000 mg | ORAL_TABLET | Freq: Once | ORAL | Status: AC
Start: 2017-05-05 — End: 2017-05-05
  Administered 2017-05-05: 1000 mg via ORAL
  Filled 2017-05-05: qty 2

## 2017-05-05 MED ORDER — ACETAMINOPHEN 500 MG PO TABS: 1000.0000 mg | ORAL_TABLET | Freq: Three times a day (TID) | ORAL | 0 refills | Status: AC
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
## 2017-12-06 ENCOUNTER — Emergency Department (HOSPITAL_BASED_OUTPATIENT_CLINIC_OR_DEPARTMENT_OTHER)
Admission: EM | Admit: 2017-12-06 | Discharge: 2017-12-06 | Disposition: A | Discharge status: Home / Self Care | Payer: Self-pay | Attending: Emergency Medicine | Admitting: Emergency Medicine

## 2017-12-06 ENCOUNTER — Encounter (HOSPITAL_BASED_OUTPATIENT_CLINIC_OR_DEPARTMENT_OTHER): Payer: Self-pay

## 2017-12-06 ENCOUNTER — Other Ambulatory Visit: Payer: Self-pay

## 2017-12-06 ENCOUNTER — Encounter (HOSPITAL_BASED_OUTPATIENT_CLINIC_OR_DEPARTMENT_OTHER): Payer: Self-pay | Admitting: Emergency Medicine

## 2017-12-06 DIAGNOSIS — Z79899 Other long term (current) drug therapy: Secondary | ICD-10-CM | POA: Insufficient documentation

## 2017-12-06 DIAGNOSIS — Z5321 Procedure and treatment not carried out due to patient leaving prior to being seen by health care provider: Secondary | ICD-10-CM | POA: Insufficient documentation

## 2017-12-06 DIAGNOSIS — M654 Radial styloid tenosynovitis [de Quervain]: Secondary | ICD-10-CM | POA: Insufficient documentation

## 2017-12-06 DIAGNOSIS — R2232 Localized swelling, mass and lump, left upper limb: Secondary | ICD-10-CM | POA: Insufficient documentation

## 2017-12-06 MED ORDER — NAPROXEN 375 MG PO TABS: 375.0000 mg | ORAL_TABLET | Freq: Two times a day (BID) | ORAL | 0 refills | Status: AC

## 2017-12-06 NOTE — Discharge Instructions (Signed)
Use splint throughout day.  Follow attached handout.  Take naproxen as prescribed with food.  Follow up with sports medicine as needed.  If you develop worsening or new concerning symptoms you can return to the emergency department for re-evaluation.

## 2017-12-06 NOTE — ED Triage Notes (Signed)
C/o swelling to left hand x today-states she had to leave work-denies injury-no swelling noted at this time-pt states "well it might have gone down some now"-NAD-steady gait

## 2017-12-06 NOTE — ED Triage Notes (Signed)
Pain in left thumb radiating up left forearm since yesterday.

## 2017-12-06 NOTE — ED Notes (Signed)
Pt verbalizes understanding of d/c instructions and denies any further needs at this time. 

## 2017-12-06 NOTE — ED Provider Notes (Signed)
[facility] EMERGENCY DEPARTMENT Provider Note   CSN: [id number] Arrival date & time: 12/06/17  1443     History   Chief Complaint Chief Complaint  Patient presents with  . Hand Pain    HPI Storie AVIS Caitlyn Jordan is a 22 y.o. right handed female no significant past medical history presents emergency department today for left hand/wrist pain.  Patient notes that since last night she has been having pain on the radial aspect of her left wrist that radiates into the base of her left thumb.  She notes this is worse after periods of using her thumb such as when texting.  She reports that she uses her hands quite frequently for work.  She notes that she has been taking ibuprofen and using ice for symptoms with mild relief.  The patient denies any fever, chills, joint swelling, overlying erythema, difficulty with range of motion of the joint, numbness, weakness or tingling.  HPI  History reviewed. No pertinent past medical history.  There are no active problems to display for this patient.   History reviewed. No pertinent surgical history.   OB History   None      Home Medications    Prior to Admission medications   Medication Sig Start Date End Date Taking? Authorizing Provider  benzonatate (TESSALON) 100 MG capsule Take 1 capsule (100 mg total) by mouth 3 (three) times daily as needed for cough. 10/28/16   Tomasita Crumble, MD  ibuprofen (ADVIL,MOTRIN) 800 MG tablet Take 1 tablet (800 mg total) by mouth 3 (three) times daily. 06/04/15   Joy, Shawn C, PA-C  methocarbamol (ROBAXIN) 500 MG tablet Take 1 tablet (500 mg total) by mouth 2 (two) times daily. 06/04/15   Joy, Hillard Danker, PA-C    Family History No family history on file.  Social History Social History   Tobacco Use  . Smoking status: Never Smoker  . Smokeless tobacco: Never Used  Substance Use Topics  . Alcohol use: No  . Drug use: No     Allergies   Patient has no known allergies.   Review of  Systems Review of Systems  All other systems reviewed and are negative.    Physical Exam Updated Vital Signs BP 119/68 (BP Location: Left Arm)   Pulse 73   Temp 98.8 F (37.1 C) (Oral)   Resp 16   Ht  (1.6 m)   Wt 88.5 kg (195 lb)   LMP 11/14/2017   SpO2 100%   BMI 34.54 kg/m   Physical Exam  Constitutional: She appears well-developed and well-nourished.  HENT:  Head: Normocephalic and atraumatic.  Right Ear: External ear normal.  Left Ear: External ear normal.  Eyes: Conjunctivae are normal. Right eye exhibits no discharge. Left eye exhibits no discharge. No scleral icterus.  Cardiovascular:  Pulses:      Radial pulses are 2+ on the right side, and 2+ on the left side.  Pulmonary/Chest: Effort normal. No respiratory distress.  Musculoskeletal:  Left hand: No gross deformities, skin intact.  No joint swelling, overlying erythema or heat.  Fingers appear normal. TTP over distal . Finger adduction/abduction intact with 5/5 strength.  Thumb opposition intact. Full active and resisted ROM to flexion/extension at wrist, MCP, PIP and DIP of all fingers.  FDS/FDP intact. Radial artery 2+ with <2sec cap refill. SILT in M/U/R distributions. Positive  Finkelstein's test.  Neurological: She is alert. She has normal strength. No sensory deficit.  Skin: Skin is warm and dry. Capillary refill  takes less than 2 seconds. No erythema. No pallor.  Psychiatric: She has a normal mood and affect.  Nursing note and vitals reviewed.    ED Treatments / Results  Labs (all labs ordered are listed, but only abnormal results are displayed) Labs Reviewed - No data to display  EKG None  Radiology No results found.  Procedures Procedures (including critical care time)  Medications Ordered in ED Medications - No data to display   Initial Impression / Assessment and Plan / ED Course  I have reviewed the triage vital signs and the nursing notes.  Pertinent labs & imaging results that  were available during my care of the patient were reviewed by me and considered in my medical decision making (see chart for details).     22 y.o. female with [name] Tenosynovitis based on history an exam. No history of trauma that would require xray at this time. Patient without fever, overlying redness/heat, joint swelling, or decreased rom that would make me concerned for septic joint. Patient placed in thumb spica.  Will treat with rest and conservative therapy.  Will provide referral to sports medicine, Shane Hudnall. Specific return precautions discussed. Time was given for all questions to be answered. The patient verbalized understanding and agreement with plan. The patient appears safe for discharge home.  Final Clinical Impressions(s) / ED Diagnoses   Final diagnoses:  De Quervain's disease (tenosynovitis)    ED Discharge Orders        Ordered    naproxen (NAPROSYN) 375 MG tablet  2 times daily     12/06/17 1517       Princella Pellegrini 12/06/17 1518    Alvira Monday, MD 12/08/17 1323

## 2018-06-13 ENCOUNTER — Encounter (HOSPITAL_BASED_OUTPATIENT_CLINIC_OR_DEPARTMENT_OTHER): Payer: Self-pay | Admitting: *Deleted

## 2018-06-13 ENCOUNTER — Emergency Department (HOSPITAL_BASED_OUTPATIENT_CLINIC_OR_DEPARTMENT_OTHER)
Admission: EM | Admit: 2018-06-13 | Discharge: 2018-06-13 | Disposition: A | Discharge status: Home / Self Care | Payer: Self-pay | Attending: Emergency Medicine | Admitting: Emergency Medicine

## 2018-06-13 ENCOUNTER — Emergency Department (HOSPITAL_BASED_OUTPATIENT_CLINIC_OR_DEPARTMENT_OTHER): Payer: Self-pay

## 2018-06-13 ENCOUNTER — Other Ambulatory Visit: Payer: Self-pay

## 2018-06-13 DIAGNOSIS — R51 Headache: Secondary | ICD-10-CM | POA: Insufficient documentation

## 2018-06-13 DIAGNOSIS — R519 Headache, unspecified: Secondary | ICD-10-CM

## 2018-06-13 DIAGNOSIS — Z79899 Other long term (current) drug therapy: Secondary | ICD-10-CM | POA: Insufficient documentation

## 2018-06-13 MED ORDER — DEXAMETHASONE SODIUM PHOSPHATE 10 MG/ML IJ SOLN
10.0000 mg | Freq: Once | INTRAMUSCULAR | Status: AC
  Administered 2018-06-13: 10 mg via INTRAVENOUS
  Filled 2018-06-13: qty 1

## 2018-06-13 MED ORDER — METOCLOPRAMIDE HCL 5 MG/ML IJ SOLN
10.0000 mg | Freq: Once | INTRAMUSCULAR | Status: AC
  Administered 2018-06-13: 10 mg via INTRAVENOUS
  Filled 2018-06-13: qty 2

## 2018-06-13 MED ORDER — SODIUM CHLORIDE 0.9 % IV BOLUS
1000.0000 mL | Freq: Once | INTRAVENOUS | Status: AC
  Administered 2018-06-13: 1000 mL via INTRAVENOUS

## 2018-06-13 MED ORDER — KETOROLAC TROMETHAMINE 30 MG/ML IJ SOLN
30.0000 mg | Freq: Once | INTRAMUSCULAR | Status: AC
  Administered 2018-06-13: 30 mg via INTRAMUSCULAR
  Filled 2018-06-13: qty 1

## 2018-06-13 NOTE — Discharge Instructions (Signed)
Please establish care with primary care provider by calling one of the number circled below.  If your headaches are continuing, I recommend you see a neurologist for further evaluation.  I also recommend getting your eyes checked at an optometrist, as eyestrain can cause you to have headaches.  Make sure to stay hydrated and eat regularly.  Please return the emergency department if you develop any new or worsening symptoms.

## 2018-06-13 NOTE — ED Triage Notes (Signed)
Headache x 2 weeks. Back pain.

## 2018-06-13 NOTE — ED Provider Notes (Signed)
[facility] EMERGENCY DEPARTMENT Provider Note   CSN: [id number] Arrival date & time: 06/13/18  1521     History   Chief Complaint Chief Complaint  Patient presents with  . Migraine    HPI Caitlyn Jordan is a 22 y.o. female who is previously healthy who presents with a 2-week history of headache.  She reports her headache started mild and has progressed gradually.  She rates her pain is an 11 out of 10.  She describes it as severe and sharp and throbbing.  She reports sometimes it is in the front of her head behind her eyes and sometimes in the back of her neck.  She denies any fevers.  She has had associated photophobia.  She denies any nausea, vomiting, abdominal pain.  She also reports some right-sided low back pain for the past couple days.  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  She used a heating pad for this.  She has been taking ibuprofen and Tylenol for her headache without relief.  She reports that she has never had a headache like this before.  She reports she occasionally get a mild headache for an hour and goes away.  She is never had a headache this severe or for this duration.  She denies any head trauma.  HPI  History reviewed. No pertinent past medical history.  There are no active problems to display for this patient.   History reviewed. No pertinent surgical history.   OB History   None      Home Medications    Prior to Admission medications   Medication Sig Start Date End Date Taking? Authorizing Provider  benzonatate (TESSALON) 100 MG capsule Take 1 capsule (100 mg total) by mouth 3 (three) times daily as needed for cough. 10/28/16   Tomasita Crumble, MD  ibuprofen (ADVIL,MOTRIN) 800 MG tablet Take 1 tablet (800 mg total) by mouth 3 (three) times daily. 06/04/15   Joy, Shawn C, PA-C  methocarbamol (ROBAXIN) 500 MG tablet Take 1 tablet (500 mg total) by mouth 2 (two) times daily. 06/04/15   Joy, Shawn C, PA-C  naproxen (NAPROSYN) 375 MG tablet Take 1  tablet (375 mg total) by mouth 2 (two) times daily. 12/06/17   Maczis, Elmer Sow, PA-C    Family History No family history on file.  Social History Social History   Tobacco Use  . Smoking status: Never Smoker  . Smokeless tobacco: Never Used  Substance Use Topics  . Alcohol use: No  . Drug use: No     Allergies   Patient has no known allergies.   Review of Systems Review of Systems  Constitutional: Negative for chills and fever.  HENT: Negative for facial swelling and sore throat.   Eyes: Positive for photophobia. Negative for visual disturbance.  Respiratory: Negative for shortness of breath.   Cardiovascular: Negative for chest pain.  Gastrointestinal: Negative for abdominal pain, nausea and vomiting.  Genitourinary: Negative for dysuria.  Musculoskeletal: Positive for back pain. Negative for neck pain.  Skin: Negative for rash and wound.  Neurological: Positive for headaches.  Psychiatric/Behavioral: The patient is not nervous/anxious.      Physical Exam Updated Vital Signs BP 126/78   Pulse 72   Temp 98.2 F (36.8 C) (Oral)   Resp 14   Ht 5\' 3"  (1.6 m)   Wt 81.6 kg   LMP 06/08/2018   SpO2 100%   BMI 31.89 kg/m   Physical Exam  Constitutional: She appears well-developed and  well-nourished. No distress.  HENT:  Head: Normocephalic and atraumatic.  Mouth/Throat: Oropharynx is clear and moist. No oropharyngeal exudate.  Eyes: Pupils are equal, round, and reactive to light. Conjunctivae and EOM are normal. Right eye exhibits no discharge. Left eye exhibits no discharge. No scleral icterus.  Neck: Normal range of motion and full passive range of motion without pain. No spinous process tenderness and no muscular tenderness present. No neck rigidity. Normal range of motion present. No thyromegaly present.  Cardiovascular: Normal rate, regular rhythm, normal heart sounds and intact distal pulses. Exam reveals no gallop and no friction rub.  No murmur  heard. Pulmonary/Chest: Effort normal and breath sounds normal. No stridor. No respiratory distress. She has no wheezes. She has no rales.  Abdominal: Soft. Bowel sounds are normal. She exhibits no distension. There is no tenderness. There is no rebound and no guarding.  Musculoskeletal: She exhibits no edema.  No midline cervical, thoracic, or lumbar tenderness Some pain on palpation and spasm noted in the right lumbar paraspinal muscles  Lymphadenopathy:    She has no cervical adenopathy.  Neurological: She is alert. Coordination normal.  CN 3-12 intact; normal sensation throughout; 5/5 strength in all 4 extremities; equal bilateral grip strength  Skin: Skin is warm and dry. No rash noted. She is not diaphoretic. No pallor.  Psychiatric: She has a normal mood and affect.  Nursing note and vitals reviewed.    ED Treatments / Results  Labs (all labs ordered are listed, but only abnormal results are displayed) Labs Reviewed - No data to display  EKG None  Radiology Ct Head Wo Contrast  Result Date: 06/13/2018 CLINICAL DATA:  Headache for 2 weeks, photosensitivity EXAM: CT HEAD WITHOUT CONTRAST TECHNIQUE: Contiguous axial images were obtained from the base of the skull through the vertex without intravenous contrast. COMPARISON:  None. FINDINGS: Brain: The ventricular system is normal in size and configuration and the septum is midline in position. The fourth ventricle and basilar cisterns are unremarkable. No hemorrhage, mass lesion, or acute infarction is seen. Vascular: No vascular abnormality is seen on this unenhanced study. Skull: On bone window images, no calvarial abnormality is seen. Sinuses/Orbits: The paranasal sinuses appear well pneumatized. Other: None. IMPRESSION: Negative unenhanced CT of the brain. Electronically Signed   By: Dwyane Dee M.D.   On: 06/13/2018 16:35    Procedures Procedures (including critical care time)  Medications Ordered in ED Medications  sodium  chloride 0.9 % bolus 1,000 mL ( Intravenous Stopped 06/13/18 1715)  ketorolac (TORADOL) 30 MG/ML injection 30 mg (30 mg Intramuscular Given 06/13/18 1611)  metoCLOPramide (REGLAN) injection 10 mg (10 mg Intravenous Given 06/13/18 1611)  dexamethasone (DECADRON) injection 10 mg (10 mg Intravenous Given 06/13/18 1610)     Initial Impression / Assessment and Plan / ED Course  I have reviewed the triage vital signs and the nursing notes.  Pertinent labs & imaging results that were available during my care of the patient were reviewed by me and considered in my medical decision making (see chart for details).     Patient with a 2-week history of headache.  She has no history of headache and describes her headache as 11/10.  Low suspicion for subarachnoid hemorrhage considering gradual onset.  Normal neuro exam without focal deficits.  Considering no history of headaches and reported severe nature, CT of the head was obtained and was negative.  Patient is feeling much better after headache cocktail and would like to go home.  Patient advised  to establish care with a primary care provider and/or see neurology if headaches are persisting.  Return precautions discussed.  Patient understands and agrees with plan.  Patient vitals stable throughout ED course and discharged in satisfactory condition.  Final Clinical Impressions(s) / ED Diagnoses   Final diagnoses:  Bad headache    ED Discharge Orders    None       Emi Holes, PA-C 06/13/18 1945    Little, Ambrose Finland, MD 06/13/18 2300

## 2019-03-12 ENCOUNTER — Encounter (HOSPITAL_BASED_OUTPATIENT_CLINIC_OR_DEPARTMENT_OTHER): Payer: Self-pay | Admitting: *Deleted

## 2019-03-12 ENCOUNTER — Other Ambulatory Visit: Payer: Self-pay

## 2019-03-12 ENCOUNTER — Emergency Department (HOSPITAL_BASED_OUTPATIENT_CLINIC_OR_DEPARTMENT_OTHER): Payer: Self-pay

## 2019-03-12 ENCOUNTER — Emergency Department (HOSPITAL_BASED_OUTPATIENT_CLINIC_OR_DEPARTMENT_OTHER)
Admission: EM | Admit: 2019-03-12 | Discharge: 2019-03-12 | Disposition: A | Discharge status: Home / Self Care | Payer: Self-pay | Attending: Emergency Medicine | Admitting: Emergency Medicine

## 2019-03-12 DIAGNOSIS — E876 Hypokalemia: Secondary | ICD-10-CM | POA: Insufficient documentation

## 2019-03-12 DIAGNOSIS — K5909 Other constipation: Secondary | ICD-10-CM | POA: Insufficient documentation

## 2019-03-12 DIAGNOSIS — R109 Unspecified abdominal pain: Secondary | ICD-10-CM

## 2019-03-12 DIAGNOSIS — Z79899 Other long term (current) drug therapy: Secondary | ICD-10-CM | POA: Insufficient documentation

## 2019-03-12 DIAGNOSIS — R52 Pain, unspecified: Secondary | ICD-10-CM

## 2019-03-12 DIAGNOSIS — N390 Urinary tract infection, site not specified: Secondary | ICD-10-CM | POA: Insufficient documentation

## 2019-03-12 LAB — COMPREHENSIVE METABOLIC PANEL
ALT: 30 U/L (ref 0–44)
AST: 23 U/L (ref 15–41)
Albumin: 3.7 g/dL (ref 3.5–5.0)
Alkaline Phosphatase: 53 U/L (ref 38–126)
Anion gap: 13 (ref 5–15)
BUN: 7 mg/dL (ref 6–20)
CO2: 26 mmol/L (ref 22–32)
Calcium: 9 mg/dL (ref 8.9–10.3)
Chloride: 97 mmol/L — ABNORMAL LOW (ref 98–111)
Creatinine, Ser: 0.66 mg/dL (ref 0.44–1.00)
GFR calc Af Amer: >60 mL/min (ref >60)
GFR calc non Af Amer: >60 mL/min (ref >60)
Glucose, Bld: 102 mg/dL — ABNORMAL HIGH (ref 70–99)
Potassium: 3.2 mmol/L — ABNORMAL LOW (ref 3.5–5.1)
Sodium: 136 mmol/L (ref 135–145)
Total Bilirubin: 0.7 mg/dL (ref 0.3–1.2)
Total Protein: 8.1 g/dL (ref 6.5–8.1)

## 2019-03-12 LAB — CBC
HCT: 37.8 % (ref 36.0–46.0)
Hemoglobin: 12 g/dL (ref 12.0–15.0)
MCH: 26.7 pg (ref 26.0–34.0)
MCHC: 31.7 g/dL (ref 30.0–36.0)
MCV: 84 fL (ref 80.0–100.0)
Platelets: 370 10*3/uL (ref 150–400)
RBC: 4.5 MIL/uL (ref 3.87–5.11)
RDW: 12.5 % (ref 11.5–15.5)
WBC: 7.9 10*3/uL (ref 4.0–10.5)
nRBC: 0 % (ref 0.0–0.2)

## 2019-03-12 LAB — URINALYSIS, MICROSCOPIC (REFLEX)

## 2019-03-12 LAB — URINALYSIS, ROUTINE W REFLEX MICROSCOPIC
Bilirubin Urine: NEGATIVE
Glucose, UA: NEGATIVE mg/dL
Ketones, ur: 15 mg/dL — AB
Nitrite: NEGATIVE
Protein, ur: NEGATIVE mg/dL
Specific Gravity, Urine: <1.005 — ABNORMAL LOW (ref 1.005–1.030)
pH: 6 (ref 5.0–8.0)

## 2019-03-12 LAB — PREGNANCY, URINE: Preg Test, Ur: NEGATIVE

## 2019-03-12 LAB — LIPASE, BLOOD: Lipase: 26 U/L (ref 11–51)

## 2019-03-12 MED ORDER — SODIUM CHLORIDE 0.9 % IV SOLN
1.0000 g | Freq: Once | INTRAVENOUS | Status: AC
  Administered 2019-03-12: 1 g via INTRAVENOUS
  Filled 2019-03-12: qty 10

## 2019-03-12 MED ORDER — POTASSIUM CHLORIDE CRYS ER 20 MEQ PO TBCR
40.0000 meq | EXTENDED_RELEASE_TABLET | Freq: Once | ORAL | Status: AC
  Administered 2019-03-12: 40 meq via ORAL
  Filled 2019-03-12: qty 2

## 2019-03-12 MED ORDER — MORPHINE SULFATE (PF) 4 MG/ML IV SOLN
4.0000 mg | Freq: Once | INTRAVENOUS | Status: AC
  Administered 2019-03-12: 4 mg via INTRAVENOUS
  Filled 2019-03-12: qty 1

## 2019-03-12 MED ORDER — CEPHALEXIN 500 MG PO CAPS: 500.0000 mg | ORAL_CAPSULE | Freq: Four times a day (QID) | ORAL | 0 refills | Status: AC

## 2019-03-12 MED ORDER — ONDANSETRON HCL 4 MG/2ML IJ SOLN
4.0000 mg | Freq: Once | INTRAMUSCULAR | Status: AC
  Administered 2019-03-12: 4 mg via INTRAVENOUS
  Filled 2019-03-12: qty 2

## 2019-03-12 NOTE — ED Triage Notes (Signed)
Pt reports constipation since last Monday, small bm on Friday, nothing since then. Reports generalized abd and back pain, pt states she is passing gas "a little bit". Took one "woman's laxative" last night with no results.

## 2019-03-12 NOTE — ED Provider Notes (Signed)
[facility] EMERGENCY DEPARTMENT Provider Note   CSN: [id number] Arrival date & time: 03/12/19  1009     History   Chief Complaint Chief Complaint  Patient presents with  . Abdominal Pain    HPI Caitlyn Jordan is a 23 y.o. female.     Patient c/o right flank pain for the past couple days. Symptoms acute onset, moderate, persistent, dull, non radiating. Also states has felt constipated for past 2 days. Last bm 2 days ago, is passing gas. No abd distension or vomiting. Denies vaginal discharge or bleeding. Urinary urgency/freq. No hematuria Denies hx gallstones. Denies vaginal discharge or bleeding. No fever or chills. Denies flank/back strain or injury.   The history is provided by the patient.  Abdominal Pain Associated symptoms: constipation   Associated symptoms: no chest pain, no fever, no shortness of breath, no sore throat, no vaginal bleeding, no vaginal discharge and no vomiting     History reviewed. No pertinent past medical history.  There are no active problems to display for this patient.   History reviewed. No pertinent surgical history.   OB History   No obstetric history on file.      Home Medications    Prior to Admission medications   Medication Sig Start Date End Date Taking? Authorizing Provider  benzonatate (TESSALON) 100 MG capsule Take 1 capsule (100 mg total) by mouth 3 (three) times daily as needed for cough. 10/28/16   Tomasita Crumbleni, Adeleke, MD  ibuprofen (ADVIL,MOTRIN) 800 MG tablet Take 1 tablet (800 mg total) by mouth 3 (three) times daily. 06/04/15   Joy, Shawn C, PA-C  methocarbamol (ROBAXIN) 500 MG tablet Take 1 tablet (500 mg total) by mouth 2 (two) times daily. 06/04/15   Joy, Shawn C, PA-C  naproxen (NAPROSYN) 375 MG tablet Take 1 tablet (375 mg total) by mouth 2 (two) times daily. 12/06/17   Maczis, Elmer SowMichael M, PA-C    Family History History reviewed. No pertinent family history.  Social History Social History   Tobacco Use  .  Smoking status: Never Smoker  . Smokeless tobacco: Never Used  Substance Use Topics  . Alcohol use: No  . Drug use: No     Allergies   Patient has no known allergies.   Review of Systems Review of Systems  Constitutional: Negative for fever.  HENT: Negative for sore throat.   Eyes: Negative for redness.  Respiratory: Negative for shortness of breath.   Cardiovascular: Negative for chest pain.  Gastrointestinal: Positive for abdominal pain and constipation. Negative for vomiting.  Genitourinary: Positive for flank pain and urgency. Negative for vaginal bleeding and vaginal discharge.  Musculoskeletal: Negative for back pain and neck pain.  Skin: Negative for rash.  Neurological: Negative for weakness, numbness and headaches.  Hematological: Does not bruise/bleed easily.  Psychiatric/Behavioral: Negative for confusion.     Physical Exam Updated Vital Signs BP (!) 155/75 (BP Location: Right Arm)   Pulse 98   Temp 98.7 F (37.1 C) (Oral)   Resp 14   Ht 1.626 m (5\' 4" )   Wt 71.3 kg   LMP 02/25/2019   SpO2 100%   BMI 26.99 kg/m   Physical Exam Vitals signs and nursing note reviewed.  Constitutional:      Appearance: Normal appearance. She is well-developed.  HENT:     Head: Atraumatic.     Nose: Nose normal.     Mouth/Throat:     Mouth: Mucous membranes are moist.  Eyes:  General: No scleral icterus.    Conjunctiva/sclera: Conjunctivae normal.  Neck:     Musculoskeletal: Normal range of motion and neck supple. No neck rigidity or muscular tenderness.     Trachea: No tracheal deviation.  Cardiovascular:     Rate and Rhythm: Normal rate and regular rhythm.     Pulses: Normal pulses.     Heart sounds: Normal heart sounds. No murmur. No friction rub. No gallop.   Pulmonary:     Effort: Pulmonary effort is normal. No respiratory distress.     Breath sounds: Normal breath sounds.  Abdominal:     General: Bowel sounds are normal. There is no distension.      Palpations: Abdomen is soft. There is no mass.     Tenderness: There is abdominal tenderness. There is no guarding or rebound.     Hernia: No hernia is present.     Comments: Mild ruq tenderness.   Genitourinary:    Comments: ?mild r cva tenderness.  Musculoskeletal:        General: No swelling.  Skin:    General: Skin is warm and dry.     Findings: No rash.  Neurological:     Mental Status: She is alert.     Comments: Alert, speech normal. Steady gait.   Psychiatric:        Mood and Affect: Mood normal.      ED Treatments / Results  Labs (all labs ordered are listed, but only abnormal results are displayed) Results for orders placed or performed during the hospital encounter of 03/12/19  Lipase, blood  Result Value Ref Range   Lipase 26 11 - 51 U/L  CBC  Result Value Ref Range   WBC 7.9 4.0 - 10.5 K/uL   RBC 4.50 3.87 - 5.11 MIL/uL   Hemoglobin 12.0 12.0 - 15.0 g/dL   HCT 16.137.8 09.636.0 - 04.546.0 %   MCV 84.0 80.0 - 100.0 fL   MCH 26.7 26.0 - 34.0 pg   MCHC 31.7 30.0 - 36.0 g/dL   RDW 40.912.5 81.111.5 - 91.415.5 %   Platelets 370 150 - 400 K/uL   nRBC 0.0 0.0 - 0.2 %  Comprehensive metabolic panel  Result Value Ref Range   Sodium 136 135 - 145 mmol/L   Potassium 3.2 (L) 3.5 - 5.1 mmol/L   Chloride 97 (L) 98 - 111 mmol/L   CO2 26 22 - 32 mmol/L   Glucose, Bld 102 (H) 70 - 99 mg/dL   BUN 7 6 - 20 mg/dL   Creatinine, Ser 7.820.66 0.44 - 1.00 mg/dL   Calcium 9.0 8.9 - 95.610.3 mg/dL   Total Protein 8.1 6.5 - 8.1 g/dL   Albumin 3.7 3.5 - 5.0 g/dL   AST 23 15 - 41 U/L   ALT 30 0 - 44 U/L   Alkaline Phosphatase 53 38 - 126 U/L   Total Bilirubin 0.7 0.3 - 1.2 mg/dL   GFR calc non Af Amer >60 >60 mL/min   GFR calc Af Amer >60 >60 mL/min   Anion gap 13 5 - 15  Urinalysis, Routine w reflex microscopic  Result Value Ref Range   Color, Urine YELLOW YELLOW   APPearance CLEAR CLEAR   Specific Gravity, Urine <1.005 (L) 1.005 - 1.030   pH 6.0 5.0 - 8.0   Glucose, UA NEGATIVE NEGATIVE mg/dL    Hgb urine dipstick SMALL (A) NEGATIVE   Bilirubin Urine NEGATIVE NEGATIVE   Ketones, ur 15 (A) NEGATIVE mg/dL  Protein, ur NEGATIVE NEGATIVE mg/dL   Nitrite NEGATIVE NEGATIVE   Leukocytes,Ua SMALL (A) NEGATIVE  Pregnancy, urine  Result Value Ref Range   Preg Test, Ur NEGATIVE NEGATIVE  Urinalysis, Microscopic (reflex)  Result Value Ref Range   RBC / HPF 6-10 0 - 5 RBC/hpf   WBC, UA 11-20 0 - 5 WBC/hpf   Bacteria, UA FEW (A) NONE SEEN   Squamous Epithelial / LPF 6-10 0 - 5   Mucus PRESENT    US Abdomen Limited Ruq  Result Date: 03/12/2019 CLINICAL DATA:  Pain for 7 days EXAM: ULTRASOUND ABDOMEN LIMITED RIGHT UPPER QUADRANT COMPARISON:  CT abdomen pelvis 01/01/2016 FINDINGS: Gallbladder: Normally distended without stones or wall thickening. No pericholecystic fluid or sonographic Murphy sign. Common bile duct: Diameter: 4 mm diameter, normal Liver: Normal appearance. No hepatic mass or nodularity. portal vein is patent on color Doppler imaging with normal direction of blood flow towards the liver. Other: No free fluid IMPRESSION: Normal exam. Electronically Signed   By: Lavonia Dana M.D.   On: 03/12/2019 11:21    EKG None  Radiology US Abdomen Limited Ruq  Result Date: 03/12/2019 CLINICAL DATA:  Pain for 7 days EXAM: ULTRASOUND ABDOMEN LIMITED RIGHT UPPER QUADRANT COMPARISON:  CT abdomen pelvis 01/01/2016 FINDINGS: Gallbladder: Normally distended without stones or wall thickening. No pericholecystic fluid or sonographic Murphy sign. Common bile duct: Diameter: 4 mm diameter, normal Liver: Normal appearance. No hepatic mass or nodularity. portal vein is patent on color Doppler imaging with normal direction of blood flow towards the liver. Other: No free fluid IMPRESSION: Normal exam. Electronically Signed   By: Lavonia Dana M.D.   On: 03/12/2019 11:21    Procedures Procedures (including critical care time)  Medications Ordered in ED Medications  morphine 4 MG/ML injection 4 mg (has no  administration in time range)  ondansetron (ZOFRAN) injection 4 mg (has no administration in time range)  cefTRIAXone (ROCEPHIN) 1 g in sodium chloride 0.9 % 100 mL IVPB (has no administration in time range)     Initial Impression / Assessment and Plan / ED Course  I have reviewed the triage vital signs and the nursing notes.  Pertinent labs & imaging results that were available during my care of the patient were reviewed by me and considered in my medical decision making (see chart for details).  Iv ns. Labs sent. U/s ordered.  Reviewed nursing notes and prior charts for additional history.   Labs reviewed by me - wbc normal. ua with 11-20 wbc, given cva tenderness, will tx for uti.   Rocephin iv. Iv ns bolus. Morphine iv. zofran iv.   U/s reviewed by me - no gallstones.  Recheck pt comfortable. Tolerating po.  abd soft nt.   Pt appears stable for d/c.   Return precautions provided.     Final Clinical Impressions(s) / ED Diagnoses   Final diagnoses:  Pain    ED Discharge Orders    None       Lajean Saver, MD 03/12/19 1311

## 2019-03-12 NOTE — Discharge Instructions (Addendum)
It was our pleasure to provide your ER care today - we hope that you feel better.  Drink plenty of fluids.  Take keflex (antibiotic) as prescribed for urine infection.  Take acetaminophen or ibuprofen as need for pain.  For constipation, you may take colace 2x/day, and miralax 1x/day as need (both of these medications are available over the counter).  Follow up with primary care doctor in 1 week if symptoms fail to improve/resolve.  From today's lab tests, your potassium level is low (3.2) - eat plenty of fruits/vegetables, and follow up with primary care doctor.   Return to ER if worse, new symptoms, high fevers, worsening or severe pain, persistent vomiting, other concern.  You were given pain medication in the ER - no driving for the next 4 hours.

## 2020-08-17 ENCOUNTER — Ambulatory Visit: Payer: Self-pay

## 2020-08-18 ENCOUNTER — Ambulatory Visit: Payer: Self-pay

## 2020-08-29 ENCOUNTER — Ambulatory Visit (HOSPITAL_COMMUNITY)
Admission: EM | Admit: 2020-08-29 | Discharge: 2020-08-29 | Disposition: A | Discharge status: Home / Self Care | Payer: Self-pay | Attending: Family Medicine | Admitting: Family Medicine

## 2020-08-29 ENCOUNTER — Other Ambulatory Visit: Payer: Self-pay

## 2020-08-29 ENCOUNTER — Encounter (HOSPITAL_COMMUNITY): Payer: Self-pay

## 2020-08-29 DIAGNOSIS — Z711 Person with feared health complaint in whom no diagnosis is made: Secondary | ICD-10-CM | POA: Insufficient documentation

## 2020-08-29 LAB — POC URINE PREG, ED: Preg Test, Ur: NEGATIVE

## 2020-08-29 LAB — HIV ANTIBODY (ROUTINE TESTING W REFLEX): HIV Screen 4th Generation wRfx: NONREACTIVE

## 2020-08-29 LAB — POCT URINALYSIS DIPSTICK, ED / UC
Bilirubin Urine: NEGATIVE
Glucose, UA: NEGATIVE mg/dL
Hgb urine dipstick: NEGATIVE
Ketones, ur: NEGATIVE mg/dL
Leukocytes,Ua: NEGATIVE
Nitrite: NEGATIVE
Protein, ur: NEGATIVE mg/dL
Specific Gravity, Urine: 1.025 (ref 1.005–1.030)
Urobilinogen, UA: 0.2 mg/dL (ref 0.0–1.0)
pH: 6.5 (ref 5.0–8.0)

## 2020-08-29 NOTE — ED Triage Notes (Signed)
Pt in requesting STD testing  Denies any sxs

## 2020-08-29 NOTE — ED Provider Notes (Signed)
[facility]    CSN: [id number] Arrival date & time: 08/29/20  1034      History   Chief Complaint Chief Complaint  Patient presents with  . STD testing    HPI Caitlyn Jordan is a 25 y.o. female.   HPI  Patient presents today for STD testing.  Patient has no symptoms.  She is here for routine screening. Patient's last menstrual period was 08/17/2020 (approximate).   History reviewed. No pertinent past medical history.  There are no problems to display for this patient.   History reviewed. No pertinent surgical history.  OB History   No obstetric history on file.      Home Medications    Prior to Admission medications   Medication Sig Start Date End Date Taking? Authorizing Provider  benzonatate (TESSALON) 100 MG capsule Take 1 capsule (100 mg total) by mouth 3 (three) times daily as needed for cough. 10/28/16   Tomasita Crumble, MD  cephALEXin (KEFLEX) 500 MG capsule Take 1 capsule (500 mg total) by mouth 4 (four) times daily. 03/12/19   Cathren Laine, MD  ibuprofen (ADVIL,MOTRIN) 800 MG tablet Take 1 tablet (800 mg total) by mouth 3 (three) times daily. 06/04/15   Joy, Shawn C, PA-C  methocarbamol (ROBAXIN) 500 MG tablet Take 1 tablet (500 mg total) by mouth 2 (two) times daily. 06/04/15   Joy, Shawn C, PA-C  naproxen (NAPROSYN) 375 MG tablet Take 1 tablet (375 mg total) by mouth 2 (two) times daily. 12/06/17   Maczis, Elmer Sow, PA-C    Family History History reviewed. No pertinent family history.  Social History Social History   Tobacco Use  . Smoking status: Never Smoker  . Smokeless tobacco: Never Used  Vaping Use  . Vaping Use: Never used  Substance Use Topics  . Alcohol use: No  . Drug use: No     Allergies   Patient has no known allergies.   Review of Systems Review of Systems Pertinent negatives listed in HPI   Physical Exam Triage Vital Signs ED Triage Vitals  Enc Vitals Group     BP 08/29/20 1150 138/82     Pulse Rate  08/29/20 1149 73     Resp 08/29/20 1149 18     Temp 08/29/20 1149 98.4 F (36.9 C)     Temp src --      SpO2 08/29/20 1149 99 %     Weight --      Height --      Head Circumference --      Peak Flow --      Pain Score 08/29/20 1150 0     Pain Loc --      Pain Edu? --      Excl. in GC? --    No data found.  Updated Vital Signs BP 138/82   Pulse 73   Temp 98.4 F (36.9 C)   Resp 18   LMP 08/17/2020 (Approximate)   SpO2 99%   Visual Acuity Right Eye Distance:   Left Eye Distance:   Bilateral Distance:    Right Eye Near:   Left Eye Near:    Bilateral Near:     Physical Exam General appearance: alert, well developed, well nourished, cooperative and no distress Head: Normocephalic, without obvious abnormality, atraumatic Respiratory: Respirations even and unlabored, normal respiratory rate Heart: rate and rhythm normal. No gallop or murmurs noted on exam  Extremities: No gross deformities Skin: Skin color, texture, turgor normal. No rashes  seen  Psych: Appropriate mood and affect. Neurologic: Alert, oriented to person, place, and time, thought content appropriate.   UC Treatments / Results  Labs (all labs ordered are listed, but only abnormal results are displayed) Labs Reviewed  PREGNANCY, URINE  URINALYSIS, DIPSTICK ONLY  CERVICOVAGINAL ANCILLARY ONLY    EKG   Radiology No results found.  Procedures Procedures (including critical care time)  Medications Ordered in UC Medications - No data to display  Initial Impression / Assessment and Plan / UC Course  I have reviewed the triage vital signs and the nursing notes.  Pertinent labs & imaging results that were available during my care of the patient were reviewed by me and considered in my medical decision making (see chart for details).    STD cytology pending.  Safe sex practices discussed.  HIV RPR pending. Advised that results will be communicated via MyChart if any abnormal taste we will contact  you. Final Clinical Impressions(s) / UC Diagnoses   Final diagnoses:  Concern about STD in female without diagnosis     Discharge Instructions     STD results will be available within 3 to 5 days.  Results will be communicated via MyChart if any  treatment is warranted we will contact you.    ED Prescriptions    None     PDMP not reviewed this encounter.   Bing Neighbors, FNP 08/29/20 1213

## 2020-08-29 NOTE — Discharge Instructions (Signed)
STD results will be available within 3 to 5 days.  Results will be communicated via MyChart if any  treatment is warranted we will contact you.

## 2020-08-30 LAB — RPR: RPR Ser Ql: NONREACTIVE

## 2020-09-02 ENCOUNTER — Telehealth (HOSPITAL_COMMUNITY): Payer: Self-pay | Admitting: Emergency Medicine

## 2020-09-02 LAB — CERVICOVAGINAL ANCILLARY ONLY
Bacterial Vaginitis (gardnerella): POSITIVE — AB
Candida Glabrata: NEGATIVE
Candida Vaginitis: NEGATIVE
Chlamydia: NEGATIVE
Comment: NEGATIVE
Comment: NEGATIVE
Comment: NEGATIVE
Comment: NEGATIVE
Comment: NEGATIVE
Comment: NORMAL
Neisseria Gonorrhea: NEGATIVE
Trichomonas: NEGATIVE

## 2020-09-02 MED ORDER — METRONIDAZOLE 500 MG PO TABS: 500.0000 mg | ORAL_TABLET | Freq: Two times a day (BID) | ORAL | 0 refills | Status: AC

## 2021-05-14 ENCOUNTER — Other Ambulatory Visit: Payer: Self-pay

## 2021-05-14 ENCOUNTER — Emergency Department (HOSPITAL_COMMUNITY): Payer: Medicaid Other

## 2021-05-14 ENCOUNTER — Encounter (HOSPITAL_COMMUNITY): Payer: Self-pay

## 2021-05-14 ENCOUNTER — Emergency Department (HOSPITAL_COMMUNITY)
Admission: EM | Admit: 2021-05-14 | Discharge: 2021-05-14 | Disposition: A | Discharge status: Home / Self Care | Payer: Medicaid Other | Attending: Emergency Medicine | Admitting: Emergency Medicine

## 2021-05-14 DIAGNOSIS — M179 Osteoarthritis of knee, unspecified: Secondary | ICD-10-CM | POA: Insufficient documentation

## 2021-05-14 DIAGNOSIS — S86912A Strain of unspecified muscle(s) and tendon(s) at lower leg level, left leg, initial encounter: Secondary | ICD-10-CM

## 2021-05-14 MED ORDER — IBUPROFEN 800 MG PO TABS: 800.0000 mg | ORAL_TABLET | Freq: Three times a day (TID) | ORAL | 0 refills | Status: AC

## 2021-05-14 MED ORDER — CYCLOBENZAPRINE HCL 5 MG PO TABS: 5.0000 mg | ORAL_TABLET | Freq: Three times a day (TID) | ORAL | 0 refills | Status: AC | PRN

## 2021-05-14 NOTE — Discharge Instructions (Signed)
You likely strained your patella tendon.  Please rest for 2 days  Take Tylenol or Motrin for pain  Take Flexeril for muscle spasms  Please try to keep your leg elevated and follow-up with orthopedic doctor   Return to ER if you have worse knee pain, unable to walk

## 2021-05-14 NOTE — ED Triage Notes (Signed)
Pt complains of left knee pain since Monday.

## 2021-05-14 NOTE — ED Provider Notes (Signed)
[facility]-EMERGENCY DEPT Provider Note   CSN: [id number] Arrival date & time: 05/14/21  0258     History Chief Complaint  Patient presents with   Knee Pain    Caitlyn Jordan is a 25 y.o. female here presenting with left knee pain.  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  She is also on her leg a lot.  She has progressive worsening left knee pain.  She states that there is some swelling as well.  Denies any trauma or injury.  She states that she took ibuprofen with minimal relief.  However she is able to still walk on the leg.  The history is provided by the patient.      History reviewed. No pertinent past medical history.  There are no problems to display for this patient.   History reviewed. No pertinent surgical history.   OB History   No obstetric history on file.     History reviewed. No pertinent family history.  Social History   Tobacco Use   Smoking status: Never   Smokeless tobacco: Never  Vaping Use   Vaping Use: Never used  Substance Use Topics   Alcohol use: No   Drug use: No    Home Medications Prior to Admission medications   Medication Sig Start Date End Date Taking? Authorizing Provider  benzonatate (TESSALON) 100 MG capsule Take 1 capsule (100 mg total) by mouth 3 (three) times daily as needed for cough. 10/28/16   Tomasita Crumble, MD  cephALEXin (KEFLEX) 500 MG capsule Take 1 capsule (500 mg total) by mouth 4 (four) times daily. 03/12/19   Cathren Laine, MD  ibuprofen (ADVIL,MOTRIN) 800 MG tablet Take 1 tablet (800 mg total) by mouth 3 (three) times daily. 06/04/15   Joy, Shawn C, PA-C  methocarbamol (ROBAXIN) 500 MG tablet Take 1 tablet (500 mg total) by mouth 2 (two) times daily. 06/04/15   Joy, Shawn C, PA-C  metroNIDAZOLE (FLAGYL) 500 MG tablet Take 1 tablet (500 mg total) by mouth 2 (two) times daily. 09/02/20   Merrilee Jansky, MD  naproxen (NAPROSYN) 375 MG tablet Take 1 tablet (375 mg total) by mouth 2 (two) times daily. 12/06/17   Maczis, Elmer Sow, PA-C    Allergies    Patient has no known allergies.  Review of Systems   Review of Systems  Musculoskeletal:        Left knee pain  All other systems reviewed and are negative.  Physical Exam Updated Vital Signs BP (!) 144/90 (BP Location: Left Arm)   Pulse 88   Temp 98.7 F (37.1 C) (Oral)   Resp 17   Ht 5\' 4"  (1.626 m)   Wt 97.5 kg   LMP 04/17/2021   SpO2 98%   BMI 36.90 kg/m   Physical Exam Vitals and nursing note reviewed.  HENT:     Head: Normocephalic.     Nose: Nose normal.     Mouth/Throat:     Mouth: Mucous membranes are moist.  Eyes:     Extraocular Movements: Extraocular movements intact.     Pupils: Pupils are equal, round, and reactive to light.  Cardiovascular:     Rate and Rhythm: Normal rate and regular rhythm.     Pulses: Normal pulses.     Heart sounds: Normal heart sounds.  Pulmonary:     Effort: Pulmonary effort is  normal.     Breath sounds: Normal breath sounds.  Abdominal:     General: Abdomen is flat.     Palpations: Abdomen is soft.  Musculoskeletal:     Cervical back: Normal range of motion and neck supple.     Comments: Mild tenderness over the patella tendon.  Patient is able to range the knee.  And there is no obvious patella alta.  Patient PCL and ACL is grossly intact.  Patient able to bear weight on the leg.  Neurological:     Mental Status: She is alert.    ED Results / Procedures / Treatments   Labs (all labs ordered are listed, but only abnormal results are displayed) Labs Reviewed - No data to display  EKG None  Radiology DG Knee Complete 4 Views Left  Result Date: 05/14/2021 CLINICAL DATA:  Left knee pain for 2 days, possible twisting injury, initial encounter EXAM: LEFT KNEE - COMPLETE 4+ VIEW COMPARISON:  None. FINDINGS: No acute fracture or dislocation is noted. No joint effusion is  seen. Very mild osteophytic changes are noted medially. IMPRESSION: Mild degenerative change without acute abnormality. Electronically Signed   By: Alcide Clever M.D.   On: 05/14/2021 03:53    Procedures Procedures   Medications Ordered in ED Medications - No data to display  ED Course  I have reviewed the triage vital signs and the nursing notes.  Pertinent labs & imaging results that were available during my care of the patient were reviewed by me and considered in my medical decision making (see chart for details).    MDM Rules/Calculators/A&P                           Caitlyn Jordan is a 25 y.o. female here presenting with left knee pain.  I think likely patella tendon strain versus ligamentous sprain. Xray showed degenerative changes with no fracture. Will dc home with NSAIDS, rest, ice, ortho follow up   Final Clinical Impression(s) / ED Diagnoses Final diagnoses:  None    Rx / DC Orders ED Discharge Orders     None        Charlynne Pander, MD 05/14/21 [phone]

## 2021-08-15 ENCOUNTER — Other Ambulatory Visit: Payer: Self-pay

## 2021-08-15 ENCOUNTER — Encounter: Payer: Self-pay | Admitting: Pharmacy Technician

## 2021-08-15 ENCOUNTER — Emergency Department
Admission: EM | Admit: 2021-08-15 | Discharge: 2021-08-15 | Disposition: A | Discharge status: Home / Self Care | Payer: Medicaid Other | Attending: Student in an Organized Health Care Education/Training Program | Admitting: Student in an Organized Health Care Education/Training Program

## 2021-08-15 DIAGNOSIS — J029 Acute pharyngitis, unspecified: Secondary | ICD-10-CM | POA: Insufficient documentation

## 2021-08-15 DIAGNOSIS — R509 Fever, unspecified: Secondary | ICD-10-CM | POA: Insufficient documentation

## 2021-08-15 LAB — GROUP A STREP BY PCR: Group A Strep by PCR: NOT DETECTED

## 2021-08-15 MED ORDER — DEXAMETHASONE 6 MG PO TABS
10.0000 mg | ORAL_TABLET | Freq: Once | ORAL | Status: AC
  Administered 2021-08-15: 10 mg via ORAL
  Filled 2021-08-15: qty 1

## 2021-08-15 MED ORDER — AMOXICILLIN 500 MG PO CAPS: 500.0000 mg | ORAL_CAPSULE | Freq: Three times a day (TID) | ORAL | 0 refills | Status: AC

## 2021-08-15 MED ORDER — DEXAMETHASONE SODIUM PHOSPHATE 10 MG/ML IJ SOLN
INTRAMUSCULAR | Status: AC
  Filled 2021-08-15: qty 1

## 2021-08-15 NOTE — ED Provider Notes (Signed)
[facility] Provider Note    Event Date/Time   First MD Initiated Contact with Patient 08/15/21 1655     (approximate)   History   Sore Throat   HPI  Caitlyn Jordan is a 26 y.o. female with no significant past medical history presents ER for evaluation of 2 to 3 days of progressively worsening sore throat and pain with swallowing.  Is having some fevers and chills no cough or congestion.  Denies any headache no nausea or vomiting.     Physical Exam   Triage Vital Signs: ED Triage Vitals  Enc Vitals Group     BP 08/15/21 1624 138/66     Pulse Rate 08/15/21 1624 94     Resp 08/15/21 1624 16     Temp 08/15/21 1624 98.9 F (37.2 C)     Temp src --      SpO2 08/15/21 1624 99 %     Weight --      Height --      Head Circumference --      Peak Flow --      Pain Score 08/15/21 1622 8     Pain Loc --      Pain Edu? --      Excl. in GC? --     Most recent vital signs: Vitals:   08/15/21 1624  BP: 138/66  Pulse: 94  Resp: 16  Temp: 98.9 F (37.2 C)  SpO2: 99%     Constitutional: Alert  Eyes: Conjunctivae are normal.  Head: Atraumatic. Nose: No congestion/rhinnorhea. Mouth/Throat: Mucous membranes are moist.  Bilateral tonsillar edema with exudates, uvula is midline Neck: Painless ROM.  Cardiovascular:   Good peripheral circulation. Respiratory: Normal respiratory effort.  No retractions.  Gastrointestinal: Soft and nontender.  Musculoskeletal:  no deformity Neurologic:  MAE spontaneously. No gross focal neurologic deficits are appreciated.  Skin:  Skin is warm, dry and intact. No rash noted. Psychiatric: Mood and affect are normal. Speech and behavior are normal.    ED Results / Procedures / Treatments   Labs (all labs ordered are listed, but only abnormal results are displayed) Labs Reviewed  GROUP A STREP BY PCR     EKG     RADIOLOGY    PROCEDURES:  Critical Care performed:   Procedures   MEDICATIONS  ORDERED IN ED: Medications - No data to display   IMPRESSION / MDM / ASSESSMENT AND PLAN / ED COURSE  I reviewed the triage vital signs and the nursing notes.                              Differential diagnosis includes, but is not limited to, pharyngitis, viral illness, strep throat, PTA, RPA, mono  Patient presenting with 2 days of sore throat. Patient is AFVSS in ED. Exam as above. Given current presentation have considered the above differential. Not consistent with PTA or RPA as no muffled voice, uvula midline, no asymmetry. + exudates. Rapid strep ordered out of triage but patient presenting clinically with strep throat will treat empirically with Decadron as well as antibiotic.  Presentation most consistent with acute viral pharyngitis at this time. Will provide symptomatic treatment and strict return precautions.        FINAL CLINICAL IMPRESSION(S) / ED DIAGNOSES   Final diagnoses:  Pharyngitis, unspecified etiology     Rx / DC Orders   ED Discharge Orders     None  Note:  This document was prepared using Dragon voice recognition software and may include unintentional dictation errors.    Willy Eddy, MD 08/15/21 [phone]

## 2021-08-15 NOTE — ED Provider Triage Note (Signed)
Emergency Medicine Provider Triage Evaluation Note  Caitlyn Jordan , a 26 y.o. female  was evaluated in triage.  Pt complains of sore throat x2 days.  Patient denies any nasal congestion, cough.  No recent sick contacts.  Pain with swallowing but no difficulty swallowing..  Review of Systems  Positive: Sore throat Negative: Fevers, nasal congestion, cough  Physical Exam  BP 138/66    Pulse 94    Temp 98.9 F (37.2 C)    Resp 16    SpO2 99%  Gen:   Awake, no distress   Resp:  Normal effort  MSK:   Moves extremities without difficulty  Other:    Medical Decision Making  Medically screening exam initiated at 4:34 PM.  Appropriate orders placed.  Caitlyn Jordan was informed that the remainder of the evaluation will be completed by another provider, this initial triage assessment does not replace that evaluation, and the importance of remaining in the ED until their evaluation is complete.  Patient presents with sore throat.  No fevers, nasal congestion, cough.  Patient not strep testing at this time.   Darletta Moll, PA-C 08/15/21 1634

## 2021-08-15 NOTE — ED Triage Notes (Signed)
Pt here with sore throat X2 days. Pt denies fevers. No difficulty swallowing.

## 2021-08-15 NOTE — ED Notes (Signed)
AAOx3.  Skin warm and dry.  NAD 

## 2021-08-17 ENCOUNTER — Other Ambulatory Visit: Payer: Self-pay

## 2021-08-17 ENCOUNTER — Encounter (HOSPITAL_BASED_OUTPATIENT_CLINIC_OR_DEPARTMENT_OTHER): Payer: Self-pay | Admitting: Radiology

## 2021-08-17 ENCOUNTER — Emergency Department (HOSPITAL_BASED_OUTPATIENT_CLINIC_OR_DEPARTMENT_OTHER)
Admission: EM | Admit: 2021-08-17 | Discharge: 2021-08-17 | Disposition: A | Discharge status: Home / Self Care | Payer: Self-pay | Attending: Emergency Medicine | Admitting: Emergency Medicine

## 2021-08-17 ENCOUNTER — Emergency Department (HOSPITAL_BASED_OUTPATIENT_CLINIC_OR_DEPARTMENT_OTHER): Payer: Self-pay

## 2021-08-17 DIAGNOSIS — R590 Localized enlarged lymph nodes: Secondary | ICD-10-CM | POA: Insufficient documentation

## 2021-08-17 DIAGNOSIS — J029 Acute pharyngitis, unspecified: Secondary | ICD-10-CM | POA: Insufficient documentation

## 2021-08-17 DIAGNOSIS — Z20822 Contact with and (suspected) exposure to covid-19: Secondary | ICD-10-CM | POA: Insufficient documentation

## 2021-08-17 LAB — MONONUCLEOSIS SCREEN: Mono Screen: NEGATIVE

## 2021-08-17 LAB — CBC WITH DIFFERENTIAL/PLATELET
Abs Immature Granulocytes: 0.03 10*3/uL (ref 0.00–0.07)
Basophils Absolute: 0.1 10*3/uL (ref 0.0–0.1)
Basophils Relative: 1 %
Eosinophils Absolute: 0.1 10*3/uL (ref 0.0–0.5)
Eosinophils Relative: 1 %
HCT: 38.1 % (ref 36.0–46.0)
Hemoglobin: 12.4 g/dL (ref 12.0–15.0)
Immature Granulocytes: 0 %
Lymphocytes Relative: 37 %
Lymphs Abs: 3.2 10*3/uL (ref 0.7–4.0)
MCH: 26.7 pg (ref 26.0–34.0)
MCHC: 32.5 g/dL (ref 30.0–36.0)
MCV: 82.1 fL (ref 80.0–100.0)
Monocytes Absolute: 0.8 10*3/uL (ref 0.1–1.0)
Monocytes Relative: 9 %
Neutro Abs: 4.7 10*3/uL (ref 1.7–7.7)
Neutrophils Relative %: 52 %
Platelets: 381 10*3/uL (ref 150–400)
RBC: 4.64 MIL/uL (ref 3.87–5.11)
RDW: 14.1 % (ref 11.5–15.5)
WBC: 8.8 10*3/uL (ref 4.0–10.5)
nRBC: 0 % (ref 0.0–0.2)

## 2021-08-17 LAB — COMPREHENSIVE METABOLIC PANEL
ALT: 23 U/L (ref 0–44)
AST: 18 U/L (ref 15–41)
Albumin: 3.6 g/dL (ref 3.5–5.0)
Alkaline Phosphatase: 46 U/L (ref 38–126)
Anion gap: 10 (ref 5–15)
BUN: 16 mg/dL (ref 6–20)
CO2: 20 mmol/L — ABNORMAL LOW (ref 22–32)
Calcium: 8.8 mg/dL — ABNORMAL LOW (ref 8.9–10.3)
Chloride: 104 mmol/L (ref 98–111)
Creatinine, Ser: 0.72 mg/dL (ref 0.44–1.00)
GFR, Estimated: >60 mL/min (ref >60)
Glucose, Bld: 103 mg/dL — ABNORMAL HIGH (ref 70–99)
Potassium: 3.5 mmol/L (ref 3.5–5.1)
Sodium: 134 mmol/L — ABNORMAL LOW (ref 135–145)
Total Bilirubin: 0.1 mg/dL — ABNORMAL LOW (ref 0.3–1.2)
Total Protein: 7.6 g/dL (ref 6.5–8.1)

## 2021-08-17 LAB — RESP PANEL BY RT-PCR (FLU A&B, COVID) ARPGX2
Influenza A by PCR: NEGATIVE
Influenza B by PCR: NEGATIVE
SARS Coronavirus 2 by RT PCR: NEGATIVE

## 2021-08-17 LAB — HCG, SERUM, QUALITATIVE: Preg, Serum: NEGATIVE

## 2021-08-17 LAB — GROUP A STREP BY PCR: Group A Strep by PCR: NOT DETECTED

## 2021-08-17 MED ORDER — PENICILLIN G BENZATHINE 1200000 UNIT/2ML IM SUSY
1.2000 10*6.[IU] | PREFILLED_SYRINGE | Freq: Once | INTRAMUSCULAR | Status: AC
  Administered 2021-08-17: 1.2 10*6.[IU] via INTRAMUSCULAR
  Filled 2021-08-17: qty 2

## 2021-08-17 MED ORDER — IOHEXOL 300 MG/ML  SOLN
100.0000 mL | Freq: Once | INTRAMUSCULAR | Status: AC | PRN
  Administered 2021-08-17: 75 mL via INTRAVENOUS

## 2021-08-17 MED ORDER — DEXAMETHASONE SODIUM PHOSPHATE 10 MG/ML IJ SOLN
10.0000 mg | Freq: Once | INTRAMUSCULAR | Status: AC
  Administered 2021-08-17: 10 mg via INTRAMUSCULAR
  Filled 2021-08-17: qty 1

## 2021-08-17 NOTE — ED Triage Notes (Signed)
Patient coming to ED for evaluation of sore throat x 3 days.  Reports no current fevers.  Painful to swallow.  Swelling and "white stuff" noted on R side of throat

## 2021-08-17 NOTE — Discharge Instructions (Signed)
You were treated for streptococcal pharyngitis today with a shot of penicillin.  You were also given a dose of steroids to help with swelling.  You should follow-up with the ear nose and throat doctor in the office for recheck this week.  Return to the ED with difficulty breathing, difficulty swallowing, chest pain, shortness of breath, any other concerns

## 2021-08-17 NOTE — ED Provider Notes (Signed)
Care was taken over from Dr. Manus Gunning.  Patient presents with sore throat and swelling for the last 3 to 4 days.  She has had 2 negative strep test.  Her monotest was negative today.  Her other labs reviewed and are nonconcerning.  CT scan shows evidence of tonsillitis but no drainable abscess.  Patient had been given IM steroids and Bicillin by Dr. Manus Gunning.  She is otherwise well-appearing.  Was discharged home in good condition.  Was given a referral to follow-up with ENT.  Was otherwise advised in symptomatic care.  Return precautions were given.   Rolan Bucco, MD 08/17/21 [phone]

## 2021-08-17 NOTE — ED Provider Notes (Signed)
[facility] Provider Note   CSN: [id number] Arrival date & time: 08/17/21  0546     History  Chief Complaint  Patient presents with   Sore Throat    Caitlyn Jordan is a 26 y.o. female.  Patient with a 4-day history of sore throat and painful swallowing.  She was seen in the ED 2 days ago and treated clinically for suspected strep with Decadron and amoxicillin prescription.  States the pharmacy was closed and she did not pick up the amoxicillin.  Comes in today with worsening pain more so to the right side of her throat now and painful swallowing.  Does not think that she has had a fever.  No difficulty breathing.  No chest pain.  No abdominal pain, nausea or vomiting.  Did have 1 day of aches but those have since resolved.  No drooling or difficulty breathing.  Feels compared to when she was seen in the ED 2 days ago the pain is worse and more concentrated on the right side. Has been using over-the-counter remedies without relief.  The history is provided by the patient.  Sore Throat Pertinent negatives include no chest pain, no abdominal pain, no headaches and no shortness of breath.      Home Medications Prior to Admission medications   Medication Sig Start Date End Date Taking? Authorizing Provider  amoxicillin (AMOXIL) 500 MG capsule Take 1 capsule (500 mg total) by mouth 3 (three) times daily for 7 days. 08/15/21 08/22/21  Merlyn Lot, MD  benzonatate (TESSALON) 100 MG capsule Take 1 capsule (100 mg total) by mouth 3 (three) times daily as needed for cough. 10/28/16   Everlene Balls, MD  cephALEXin (KEFLEX) 500 MG capsule Take 1 capsule (500 mg total) by mouth 4 (four) times daily. 03/12/19   Lajean Saver, MD  cyclobenzaprine (FLEXERIL) 5 MG tablet Take 1 tablet (5 mg total) by mouth 3 (three) times daily as needed for muscle spasms. 05/14/21   Drenda Freeze, MD  ibuprofen (ADVIL) 800 MG tablet Take 1 tablet (800 mg total) by mouth 3 (three)  times daily. 05/14/21   Drenda Freeze, MD  methocarbamol (ROBAXIN) 500 MG tablet Take 1 tablet (500 mg total) by mouth 2 (two) times daily. 06/04/15   Joy, Shawn C, PA-C  metroNIDAZOLE (FLAGYL) 500 MG tablet Take 1 tablet (500 mg total) by mouth 2 (two) times daily. 09/02/20   Chase Picket, MD  naproxen (NAPROSYN) 375 MG tablet Take 1 tablet (375 mg total) by mouth 2 (two) times daily. 12/06/17   Maczis, Barth Kirks, PA-C      Allergies    Patient has no known allergies.    Review of Systems   Review of Systems  Constitutional:  Negative for activity change, appetite change and fever.  HENT:  Positive for sore throat, trouble swallowing and voice change. Negative for congestion.   Respiratory:  Negative for cough and shortness of breath.   Cardiovascular:  Negative for chest pain.  Gastrointestinal:  Negative for abdominal pain, nausea and vomiting.  Genitourinary:  Negative for dysuria and hematuria.  Musculoskeletal:  Negative for arthralgias, back pain and myalgias.  Skin:  Negative for rash.  Neurological:  Negative for dizziness, weakness and headaches.   all other systems are negative except as noted in the HPI and PMH.   Physical Exam Updated Vital Signs BP 137/77 (BP Location: Right Arm)    Pulse 95    Temp 98.4 F (36.9 C) (  Oral)    Resp 18    SpO2 99%  Physical Exam Vitals and nursing note reviewed.  Constitutional:      General: She is not in acute distress.    Appearance: She is well-developed.  HENT:     Head: Normocephalic and atraumatic.     Right Ear: Tympanic membrane normal.     Left Ear: Tympanic membrane normal.     Nose: No congestion or rhinorrhea.     Mouth/Throat:     Mouth: Mucous membranes are moist.     Pharynx: Oropharyngeal exudate present.     Comments: Asymmetric tonsillar edema right greater than left.  Exudates present bilaterally.  Uvula midline.  Controlling secretions. No stridor or trismus Eyes:     Conjunctiva/sclera: Conjunctivae  normal.     Pupils: Pupils are equal, round, and reactive to light.  Neck:     Comments: No meningismus. Cardiovascular:     Rate and Rhythm: Normal rate and regular rhythm.     Heart sounds: Normal heart sounds. No murmur heard. Pulmonary:     Effort: Pulmonary effort is normal. No respiratory distress.     Breath sounds: Normal breath sounds.  Abdominal:     Palpations: Abdomen is soft.     Tenderness: There is no abdominal tenderness. There is no guarding or rebound.  Musculoskeletal:        General: No tenderness. Normal range of motion.     Cervical back: Normal range of motion and neck supple.  Lymphadenopathy:     Cervical: Cervical adenopathy present.  Skin:    General: Skin is warm.  Neurological:     Mental Status: She is alert and oriented to person, place, and time.     Cranial Nerves: No cranial nerve deficit.     Motor: No abnormal muscle tone.     Coordination: Coordination normal.     Comments:  5/5 strength throughout. CN 2-12 intact.Equal grip strength.   Psychiatric:        Behavior: Behavior normal.    ED Results / Procedures / Treatments   Labs (all labs ordered are listed, but only abnormal results are displayed) Labs Reviewed  GROUP A STREP BY PCR  RESP PANEL BY RT-PCR (FLU A&B, COVID) ARPGX2  CBC WITH DIFFERENTIAL/PLATELET  COMPREHENSIVE METABOLIC PANEL  MONONUCLEOSIS SCREEN  HCG, SERUM, QUALITATIVE    EKG None  Radiology No results found.  Procedures Procedures    Medications Ordered in ED Medications  penicillin g benzathine (BICILLIN LA) 1200000 UNIT/2ML injection 1.2 Million Units (has no administration in time range)  dexamethasone (DECADRON) injection 10 mg (has no administration in time range)    ED Course/ Medical Decision Making/ A&P                           Medical Decision Making 4 days of sore throat and difficulty swallowing.  Controlling secretions, no stridor or trismus.  Strep test on January 7 was negative but  she was treated with amoxicillin but did not pick up prescription.  Asymmetric tonsillar swelling here today will obtain imaging to rule out abscess.  Give IM steroids and IM Bicillin.  CT imaging and labs pending at shift change to evaluate for peritonsillar abscess.  Patient given dose of IM steroids and IM Bicillin.  Referral to ENT given.  Care transferred at shift change to Dr. Tamera Punt.         Final Clinical Impression(s) / ED Diagnoses  Final diagnoses:  Acute pharyngitis, unspecified etiology    Rx / DC Orders ED Discharge Orders     None         Vasilisa Vore, Annie Main, MD 08/17/21 [phone]

## 2022-05-31 IMAGING — CR DG KNEE COMPLETE 4+V*L*
4 series · 4 of 4 positions shown · non-contrast
Comparison: None.

CLINICAL DATA: Left knee pain for 2 days, possible twisting injury,
initial encounter

EXAM:
LEFT KNEE - COMPLETE 4+ VIEW

[t knee ap left]
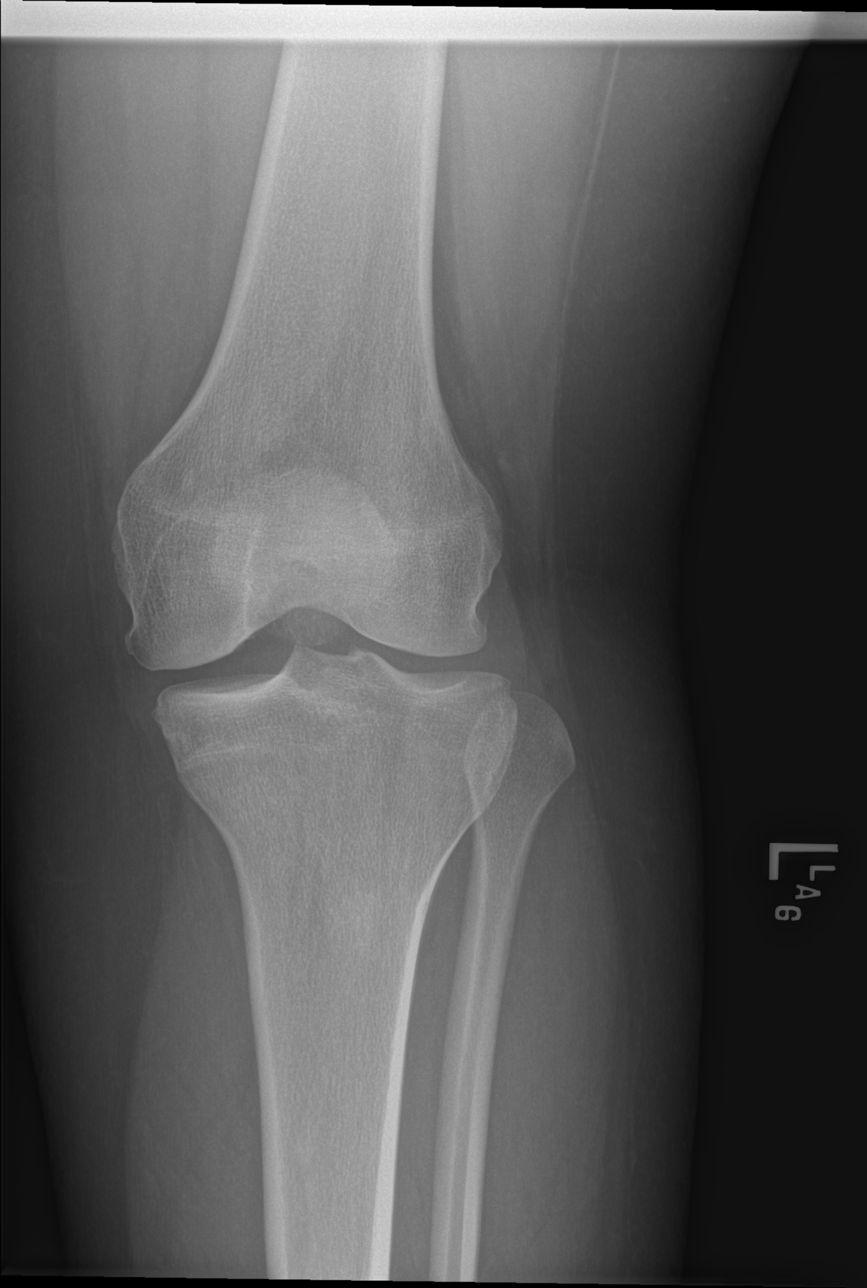

[t knee obl left (1 of 2)]
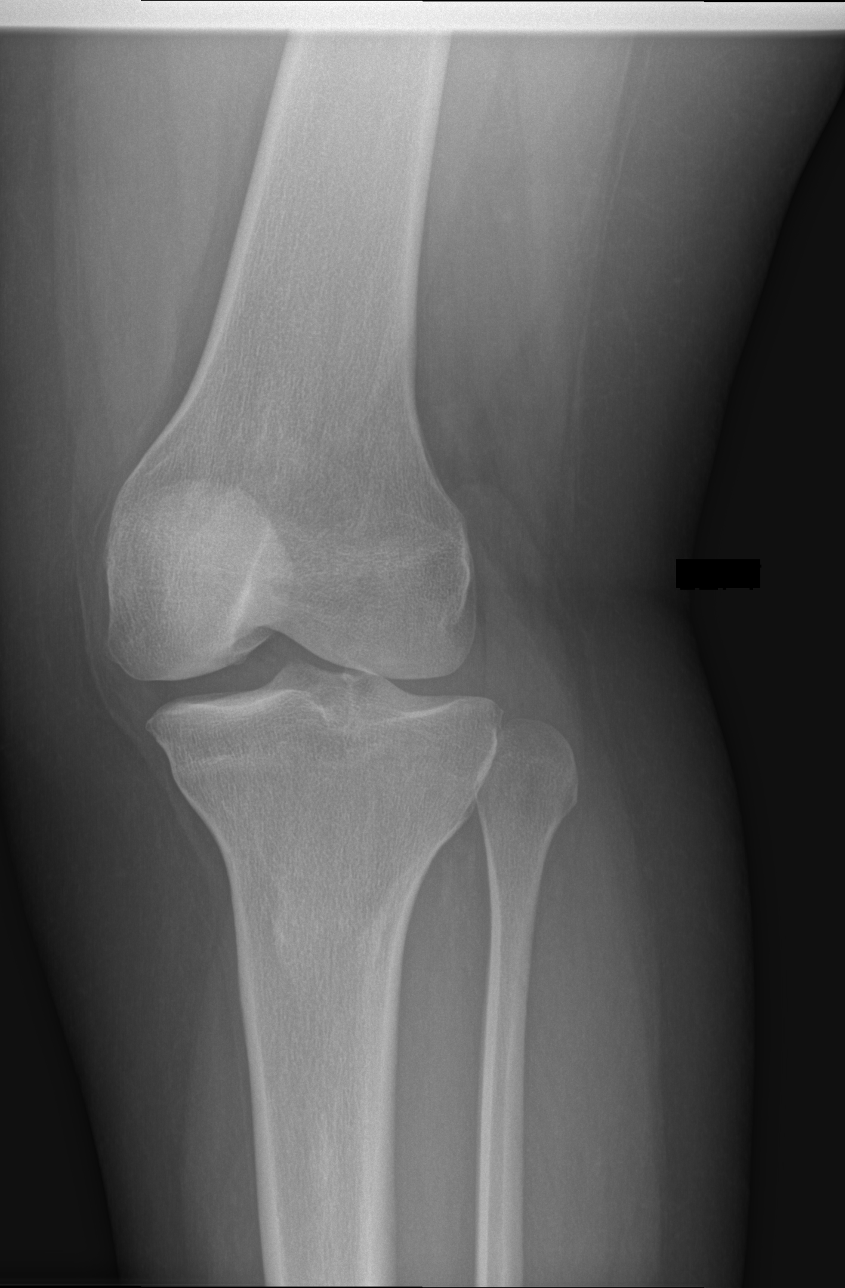

[t knee obl left (2 of 2)]
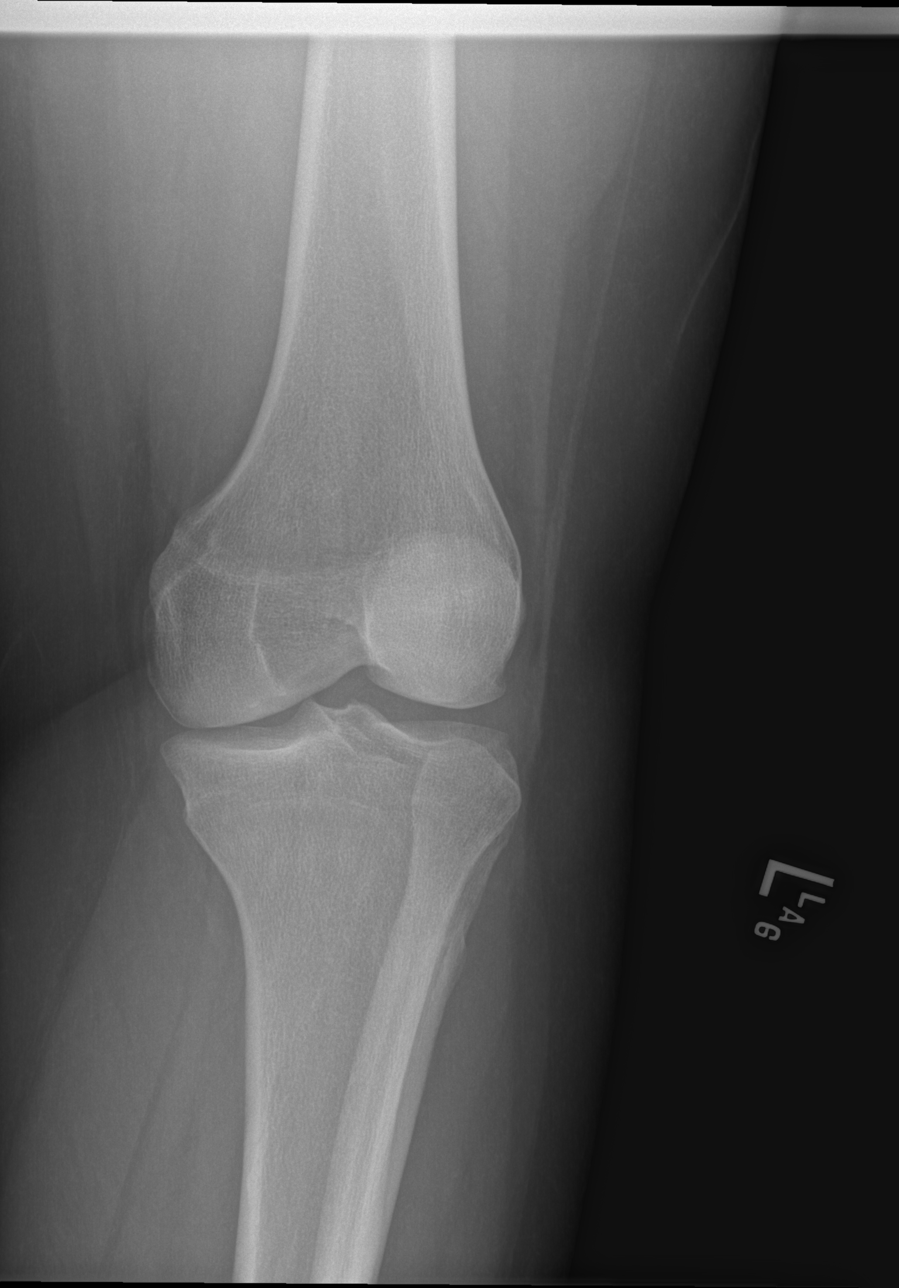

[t knee lat left]
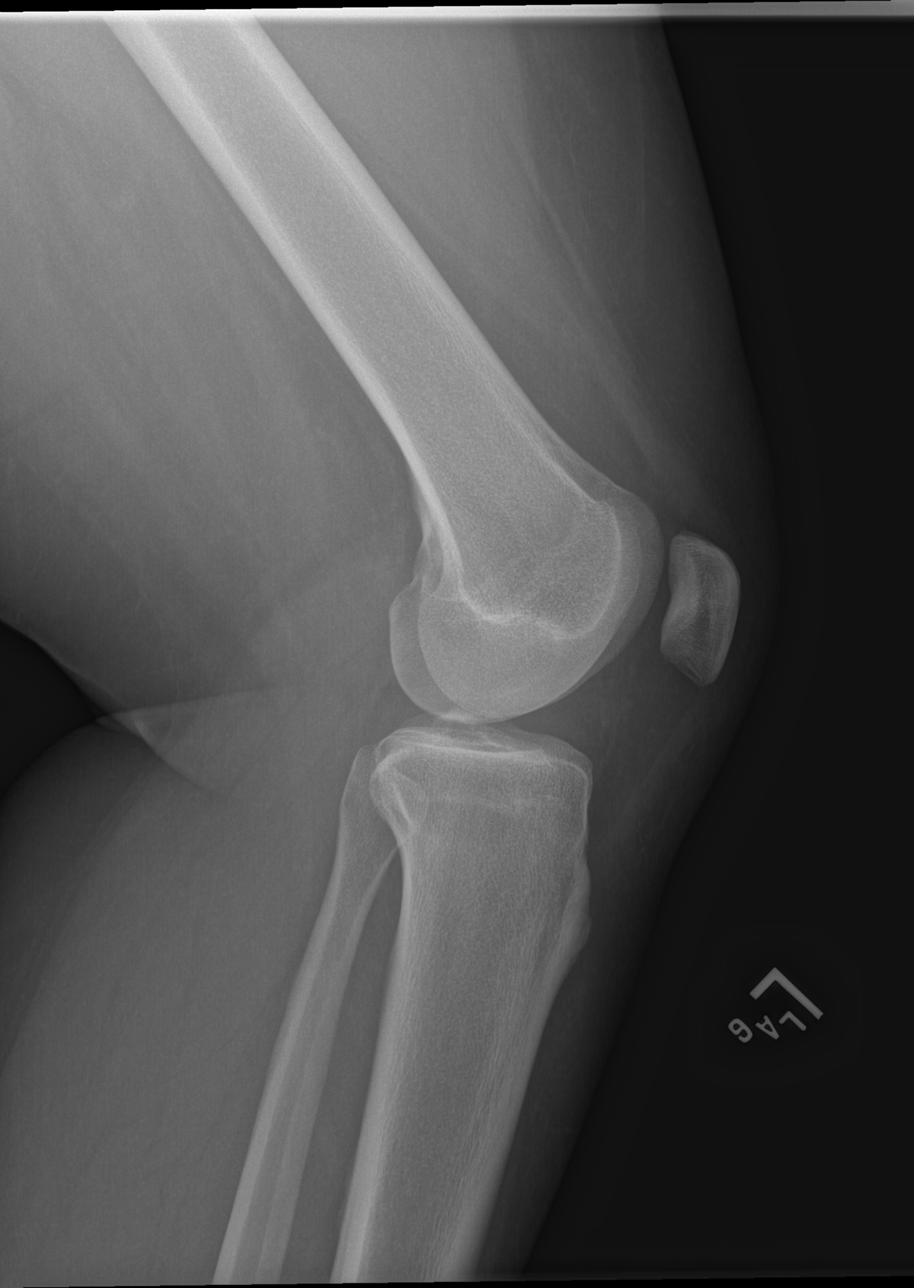

[4 of 4 positions shown; findings below may reference images not displayed]

FINDINGS: No acute fracture or dislocation is noted. No joint effusion is
seen. Very mild osteophytic changes are noted medially.
IMPRESSION: Mild degenerative change without acute abnormality.

## 2022-10-08 ENCOUNTER — Telehealth: Payer: Self-pay

## 2022-10-08 NOTE — Telephone Encounter (Signed)
Mychart msg sent

## 2022-12-13 ENCOUNTER — Other Ambulatory Visit: Payer: Self-pay

## 2022-12-13 ENCOUNTER — Encounter (HOSPITAL_BASED_OUTPATIENT_CLINIC_OR_DEPARTMENT_OTHER): Payer: Self-pay | Admitting: Emergency Medicine

## 2022-12-13 ENCOUNTER — Emergency Department (HOSPITAL_BASED_OUTPATIENT_CLINIC_OR_DEPARTMENT_OTHER)
Admission: EM | Admit: 2022-12-13 | Discharge: 2022-12-13 | Disposition: A | Discharge status: Home / Self Care | Payer: Medicaid Other | Attending: Emergency Medicine | Admitting: Emergency Medicine

## 2022-12-13 DIAGNOSIS — R509 Fever, unspecified: Secondary | ICD-10-CM | POA: Insufficient documentation

## 2022-12-13 DIAGNOSIS — R1110 Vomiting, unspecified: Secondary | ICD-10-CM

## 2022-12-13 DIAGNOSIS — F17210 Nicotine dependence, cigarettes, uncomplicated: Secondary | ICD-10-CM | POA: Diagnosis not present

## 2022-12-13 DIAGNOSIS — R112 Nausea with vomiting, unspecified: Secondary | ICD-10-CM | POA: Insufficient documentation

## 2022-12-13 DIAGNOSIS — R1084 Generalized abdominal pain: Secondary | ICD-10-CM | POA: Diagnosis not present

## 2022-12-13 DIAGNOSIS — R109 Unspecified abdominal pain: Secondary | ICD-10-CM | POA: Diagnosis present

## 2022-12-13 LAB — COMPREHENSIVE METABOLIC PANEL
ALT: 17 U/L (ref 0–44)
AST: 20 U/L (ref 15–41)
Albumin: 4 g/dL (ref 3.5–5.0)
Alkaline Phosphatase: 57 U/L (ref 38–126)
Anion gap: 9 (ref 5–15)
BUN: 15 mg/dL (ref 6–20)
CO2: 22 mmol/L (ref 22–32)
Calcium: 8.8 mg/dL — ABNORMAL LOW (ref 8.9–10.3)
Chloride: 103 mmol/L (ref 98–111)
Creatinine, Ser: 0.92 mg/dL (ref 0.44–1.00)
GFR, Estimated: >60 mL/min (ref >60)
Glucose, Bld: 125 mg/dL — ABNORMAL HIGH (ref 70–99)
Potassium: 3.8 mmol/L (ref 3.5–5.1)
Sodium: 134 mmol/L — ABNORMAL LOW (ref 135–145)
Total Bilirubin: 0.7 mg/dL (ref 0.3–1.2)
Total Protein: 8.2 g/dL — ABNORMAL HIGH (ref 6.5–8.1)

## 2022-12-13 LAB — CBC WITH DIFFERENTIAL/PLATELET
Abs Immature Granulocytes: 0.01 10*3/uL (ref 0.00–0.07)
Basophils Absolute: 0 10*3/uL (ref 0.0–0.1)
Basophils Relative: 0 %
Eosinophils Absolute: 0.1 10*3/uL (ref 0.0–0.5)
Eosinophils Relative: 1 %
HCT: 42.4 % (ref 36.0–46.0)
Hemoglobin: 13.4 g/dL (ref 12.0–15.0)
Immature Granulocytes: 0 %
Lymphocytes Relative: 18 %
Lymphs Abs: 1.2 10*3/uL (ref 0.7–4.0)
MCH: 25.5 pg — ABNORMAL LOW (ref 26.0–34.0)
MCHC: 31.6 g/dL (ref 30.0–36.0)
MCV: 80.8 fL (ref 80.0–100.0)
Monocytes Absolute: 0.4 10*3/uL (ref 0.1–1.0)
Monocytes Relative: 6 %
Neutro Abs: 5.1 10*3/uL (ref 1.7–7.7)
Neutrophils Relative %: 75 %
Platelets: 401 10*3/uL — ABNORMAL HIGH (ref 150–400)
RBC: 5.25 MIL/uL — ABNORMAL HIGH (ref 3.87–5.11)
RDW: 13.8 % (ref 11.5–15.5)
WBC: 6.7 10*3/uL (ref 4.0–10.5)
nRBC: 0 % (ref 0.0–0.2)

## 2022-12-13 LAB — URINALYSIS, MICROSCOPIC (REFLEX)

## 2022-12-13 LAB — URINALYSIS, ROUTINE W REFLEX MICROSCOPIC
Bilirubin Urine: NEGATIVE
Glucose, UA: NEGATIVE mg/dL
Ketones, ur: NEGATIVE mg/dL
Leukocytes,Ua: NEGATIVE
Nitrite: NEGATIVE
Protein, ur: NEGATIVE mg/dL
Specific Gravity, Urine: 1.025 (ref 1.005–1.030)
pH: 6.5 (ref 5.0–8.0)

## 2022-12-13 LAB — PREGNANCY, URINE: Preg Test, Ur: NEGATIVE

## 2022-12-13 LAB — LIPASE, BLOOD: Lipase: 31 U/L (ref 11–51)

## 2022-12-13 MED ORDER — ONDANSETRON HCL 4 MG/2ML IJ SOLN
4.0000 mg | Freq: Once | INTRAMUSCULAR | Status: AC
Start: 2022-12-13 — End: 2022-12-13
  Administered 2022-12-13: 4 mg via INTRAVENOUS
  Filled 2022-12-13: qty 2

## 2022-12-13 MED ORDER — KETOROLAC TROMETHAMINE 15 MG/ML IJ SOLN
15.0000 mg | Freq: Once | INTRAMUSCULAR | Status: AC
Start: 2022-12-13 — End: 2022-12-13
  Administered 2022-12-13: 15 mg via INTRAVENOUS
  Filled 2022-12-13: qty 1

## 2022-12-13 MED ORDER — DICYCLOMINE HCL 20 MG PO TABS: 20.0000 mg | ORAL_TABLET | Freq: Two times a day (BID) | ORAL | 0 refills | Status: AC

## 2022-12-13 MED ORDER — SODIUM CHLORIDE 0.9 % IV BOLUS
1000.0000 mL | Freq: Once | INTRAVENOUS | Status: AC
  Administered 2022-12-13: 1000 mL via INTRAVENOUS

## 2022-12-13 MED ORDER — ONDANSETRON 4 MG PO TBDP: ORAL_TABLET | ORAL | 0 refills | Status: AC

## 2022-12-13 MED ORDER — ACETAMINOPHEN 500 MG PO TABS
1000.0000 mg | ORAL_TABLET | Freq: Once | ORAL | Status: AC
  Administered 2022-12-13: 1000 mg via ORAL
  Filled 2022-12-13: qty 2

## 2022-12-13 NOTE — Discharge Instructions (Signed)
Take 4 over the counter ibuprofen tablets 3 times a day or 2 over-the-counter naproxen tablets twice a day for pain. Also take tylenol 1000mg (2 extra strength) four times a day.   Your blood work today looked reassuring.  Does not mean that everything is guaranteed to be okay.  Please return if you are unable to eat or drink or if your pain goes from all over the 1 spot.  Please follow-up with your family doctor in the office.  If you not have 1 there is a family doctor in this building which I have given you information for.

## 2022-12-13 NOTE — ED Provider Notes (Signed)
[facility] Provider Note: Caitlyn Dell, MD, FACEP  CSN: [id number] MRN: [id number] ARRIVAL: 12/13/22 at 0555 ROOM: MH10/MH10   CHIEF COMPLAINT  Abdominal Pain   HISTORY OF PRESENT ILLNESS  12/13/22 6:26 AM Caitlyn Jordan is a 27 y.o. female who developed generalized, poorly characterized, abdominal pain.  She rated it as a 7 or 8 out of 10.  This morning prior to arrival she vomited several times which relieved some of her abdominal discomfort.  She now rates her pain as a 5 out of 10.  She has not had diarrhea with this.  She was noted to be febrile on arrival with a temperature of 101.2 and was given acetaminophen 1 g.  She was also given Zofran 4 mg IV for her nausea.   History reviewed. No pertinent past medical history.  History reviewed. No pertinent surgical history.  History reviewed. No pertinent family history.  Social History   Tobacco Use   Smoking status: Some Days    Types: Cigarettes, Cigars   Smokeless tobacco: Never  Vaping Use   Vaping Use: Never used  Substance Use Topics   Alcohol use: Yes   Drug use: No    Prior to Admission medications   Medication Sig Start Date End Date Taking? Authorizing Provider  dicyclomine (BENTYL) 20 MG tablet Take 1 tablet (20 mg total) by mouth 2 (two) times daily. 12/13/22  Yes Melene Plan, DO  ondansetron (ZOFRAN-ODT) 4 MG disintegrating tablet 4mg  ODT q4 hours prn nausea/vomit 12/13/22  Yes Melene Plan, DO  benzonatate (TESSALON) 100 MG capsule Take 1 capsule (100 mg total) by mouth 3 (three) times daily as needed for cough. 10/28/16   Tomasita Crumble, MD  cephALEXin (KEFLEX) 500 MG capsule Take 1 capsule (500 mg total) by mouth 4 (four) times daily. 03/12/19   Cathren Laine, MD  cyclobenzaprine (FLEXERIL) 5 MG tablet Take 1 tablet (5 mg total) by mouth 3 (three) times daily as needed for muscle spasms. 05/14/21   Charlynne Pander, MD  ibuprofen (ADVIL) 800 MG tablet Take 1 tablet (800 mg total) by mouth 3 (three) times  daily. 05/14/21   Charlynne Pander, MD  methocarbamol (ROBAXIN) 500 MG tablet Take 1 tablet (500 mg total) by mouth 2 (two) times daily. 06/04/15   Joy, Shawn C, PA-C  metroNIDAZOLE (FLAGYL) 500 MG tablet Take 1 tablet (500 mg total) by mouth 2 (two) times daily. 09/02/20   Merrilee Jansky, MD  naproxen (NAPROSYN) 375 MG tablet Take 1 tablet (375 mg total) by mouth 2 (two) times daily. 12/06/17   Maczis, Elmer Sow, PA-C    Allergies Patient has no known allergies.   REVIEW OF SYSTEMS  Negative except as noted here or in the History of Present Illness.   PHYSICAL EXAMINATION  Initial Vital Signs Blood pressure (!) 174/114, pulse (!) 120, temperature (!) 101.2 F (38.4 C), temperature source Oral, resp. rate 17, height 5\' 4"  (1.626 m), weight 111.1 kg, last menstrual period 11/30/2022, SpO2 98 %.  Examination General: Well-developed, well-nourished female in no acute distress; appearance consistent with age of record HENT: normocephalic; atraumatic Eyes: Normal appearance Neck: supple Heart: regular rate and rhythm; tachycardia Lungs: clear to auscultation bilaterally Abdomen: soft; nondistended; mild diffuse tenderness; bowel sounds present Extremities: No deformity; full range of motion; pulses normal Neurologic: Awake, alert and oriented; motor function intact in all extremities and symmetric; no facial droop Skin: Warm and dry Psychiatric: Normal mood and affect   RESULTS  Summary  of this visit's results, reviewed and interpreted by myself:   EKG Interpretation  Date/Time:    Ventricular Rate:    PR Interval:    QRS Duration:   QT Interval:    QTC Calculation:   R Axis:     Text Interpretation:         Laboratory Studies: Results for orders placed or performed during the hospital encounter of 12/13/22 (from the past 24 hour(s))  Lipase, blood     Status: None   Collection Time: 12/13/22  6:14 AM  Result Value Ref Range   Lipase 31 11 - 51 U/L  Comprehensive  metabolic panel     Status: Abnormal   Collection Time: 12/13/22  6:14 AM  Result Value Ref Range   Sodium 134 (L) 135 - 145 mmol/L   Potassium 3.8 3.5 - 5.1 mmol/L   Chloride 103 98 - 111 mmol/L   CO2 22 22 - 32 mmol/L   Glucose, Bld 125 (H) 70 - 99 mg/dL   BUN 15 6 - 20 mg/dL   Creatinine, Ser 1.61 0.44 - 1.00 mg/dL   Calcium 8.8 (L) 8.9 - 10.3 mg/dL   Total Protein 8.2 (H) 6.5 - 8.1 g/dL   Albumin 4.0 3.5 - 5.0 g/dL   AST 20 15 - 41 U/L   ALT 17 0 - 44 U/L   Alkaline Phosphatase 57 38 - 126 U/L   Total Bilirubin 0.7 0.3 - 1.2 mg/dL   GFR, Estimated >09 >60 mL/min   Anion gap 9 5 - 15  CBC with Differential     Status: Abnormal   Collection Time: 12/13/22  6:14 AM  Result Value Ref Range   WBC 6.7 4.0 - 10.5 K/uL   RBC 5.25 (H) 3.87 - 5.11 MIL/uL   Hemoglobin 13.4 12.0 - 15.0 g/dL   HCT 45.4 09.8 - 11.9 %   MCV 80.8 80.0 - 100.0 fL   MCH 25.5 (L) 26.0 - 34.0 pg   MCHC 31.6 30.0 - 36.0 g/dL   RDW 14.7 82.9 - 56.2 %   Platelets 401 (H) 150 - 400 K/uL   nRBC 0.0 0.0 - 0.2 %   Neutrophils Relative % 75 %   Neutro Abs 5.1 1.7 - 7.7 K/uL   Lymphocytes Relative 18 %   Lymphs Abs 1.2 0.7 - 4.0 K/uL   Monocytes Relative 6 %   Monocytes Absolute 0.4 0.1 - 1.0 K/uL   Eosinophils Relative 1 %   Eosinophils Absolute 0.1 0.0 - 0.5 K/uL   Basophils Relative 0 %   Basophils Absolute 0.0 0.0 - 0.1 K/uL   Immature Granulocytes 0 %   Abs Immature Granulocytes 0.01 0.00 - 0.07 K/uL  Urinalysis, Routine w reflex microscopic -Urine, Clean Catch     Status: Abnormal   Collection Time: 12/13/22  6:36 AM  Result Value Ref Range   Color, Urine YELLOW YELLOW   APPearance CLEAR CLEAR   Specific Gravity, Urine 1.025 1.005 - 1.030   pH 6.5 5.0 - 8.0   Glucose, UA NEGATIVE NEGATIVE mg/dL   Hgb urine dipstick TRACE (A) NEGATIVE   Bilirubin Urine NEGATIVE NEGATIVE   Ketones, ur NEGATIVE NEGATIVE mg/dL   Protein, ur NEGATIVE NEGATIVE mg/dL   Nitrite NEGATIVE NEGATIVE   Leukocytes,Ua NEGATIVE  NEGATIVE  Pregnancy, urine     Status: None   Collection Time: 12/13/22  6:36 AM  Result Value Ref Range   Preg Test, Ur NEGATIVE NEGATIVE  Urinalysis, Microscopic (reflex)  Status: Abnormal   Collection Time: 12/13/22  6:36 AM  Result Value Ref Range   RBC / HPF 0-5 0 - 5 RBC/hpf   WBC, UA 0-5 0 - 5 WBC/hpf   Bacteria, UA FEW (A) NONE SEEN   Squamous Epithelial / HPF 6-10 0 - 5 /HPF   Imaging Studies: No results found.  ED COURSE and MDM  Nursing notes, initial and subsequent vitals signs, including pulse oximetry, reviewed and interpreted by myself.  Vitals:   12/13/22 0615 12/13/22 0720 12/13/22 0730 12/13/22 0745  BP: (!) 168/88 (!) 143/86    Pulse: (!) 117 96 (!) 107 (!) 101  Resp:  18    Temp:      TempSrc:      SpO2: 97% 99% 100% 99%  Weight:      Height:       Medications  ondansetron (ZOFRAN) injection 4 mg (4 mg Intravenous Given 12/13/22 0613)  acetaminophen (TYLENOL) tablet 1,000 mg (1,000 mg Oral Given 12/13/22 0639)  sodium chloride 0.9 % bolus 1,000 mL (0 mLs Intravenous Stopped 12/13/22 0758)  ketorolac (TORADOL) 15 MG/ML injection 15 mg (15 mg Intravenous Given 12/13/22 0744)   6:55 AM Presentation consistent with a viral gastroenteritis given her fever.  I doubt a focal process such as appendicitis or cholecystitis given her diffuse pain and only mild tenderness.  Patient is receiving IV fluids at this time and has been signed out to Dr. Adela Lank who will reevaluate and make disposition.   PROCEDURES  Procedures   ED DIAGNOSES     ICD-10-CM   1. Generalized abdominal pain  R10.84     2. Vomiting in adult  R11.10          Nesreen Albano, Jonny Ruiz, MD 12/13/22 2244

## 2022-12-13 NOTE — ED Triage Notes (Signed)
Generalized abdominal pain and n/v since yesterday. Describes pain as intermittent, "aching", 7/10. Denies urinary sx. Denies vaginal discharge/bleeding. Denies diarrhea.

## 2022-12-13 NOTE — ED Provider Notes (Signed)
Received patient in turnover from Dr. Read Drivers.  Please see their note for further details of Hx, PE.  Briefly patient is a 27 y.o. female with a Abdominal Pain .  Patient with nausea vomiting and diarrhea.  Plan for repeat assessment.  Patient feeling bit better on repeat assessment.  Complaining mostly of low back pain worse with movement palpation twisting.  No CVA tenderness for me on exam.  Mild diffuse abdominal discomfort without focality.  She is able to tolerate by mouth here without issue.  Will discharge home.  PCP follow-up.    Caitlyn Jordan Plan, DO 12/13/22 [phone]

## 2022-12-26 ENCOUNTER — Encounter (HOSPITAL_BASED_OUTPATIENT_CLINIC_OR_DEPARTMENT_OTHER): Payer: Self-pay | Admitting: Emergency Medicine

## 2022-12-26 ENCOUNTER — Other Ambulatory Visit: Payer: Self-pay

## 2022-12-26 ENCOUNTER — Emergency Department (HOSPITAL_BASED_OUTPATIENT_CLINIC_OR_DEPARTMENT_OTHER)
Admission: EM | Admit: 2022-12-26 | Discharge: 2022-12-26 | Disposition: A | Discharge status: Home / Self Care | Payer: Medicaid Other | Attending: Emergency Medicine | Admitting: Emergency Medicine

## 2022-12-26 ENCOUNTER — Emergency Department (HOSPITAL_BASED_OUTPATIENT_CLINIC_OR_DEPARTMENT_OTHER): Payer: Medicaid Other

## 2022-12-26 DIAGNOSIS — M25562 Pain in left knee: Secondary | ICD-10-CM | POA: Diagnosis not present

## 2022-12-26 MED ORDER — NAPROXEN 375 MG PO TABS: 375.0000 mg | ORAL_TABLET | Freq: Two times a day (BID) | ORAL | 0 refills | Status: AC

## 2022-12-26 MED ORDER — ACETAMINOPHEN 500 MG PO TABS
1000.0000 mg | ORAL_TABLET | Freq: Once | ORAL | Status: AC
  Administered 2022-12-26: 1000 mg via ORAL
  Filled 2022-12-26: qty 2

## 2022-12-26 MED ORDER — NAPROXEN 250 MG PO TABS
500.0000 mg | ORAL_TABLET | Freq: Once | ORAL | Status: AC
  Administered 2022-12-26: 500 mg via ORAL
  Filled 2022-12-26: qty 2

## 2022-12-26 NOTE — ED Triage Notes (Signed)
Reports L knee pain that started Friday. No known injury. Pt states that Friday at work she had to stand all day, she's not sure if that is why it hurts. Pt is ambulatory with slight limp. Pt reports L knee looks swollen compared to baseline. She reports trying ice to help with pain without relief.

## 2022-12-26 NOTE — ED Provider Notes (Signed)
[facility] Provider Note   CSN: [id number] Arrival date & time: 12/26/22  0141     History  Chief Complaint  Patient presents with   Knee Pain    Caitlyn Jordan is a 27 y.o. female.  The history is provided by the patient.  Knee Pain Location:  Knee Lower extremity injury: unknown.   Knee location:  L knee Pain details:    Radiates to:  Does not radiate   Severity:  Moderate   Onset quality:  Sudden   Duration:  2 days   Timing:  Constant   Progression:  Unchanged Chronicity:  New Dislocation: no   Relieved by:  Nothing Worsened by:  Nothing Ineffective treatments:  Ice Associated symptoms: no fever and no swelling   Risk factors: no concern for non-accidental trauma        Home Medications Prior to Admission medications   Medication Sig Start Date End Date Taking? Authorizing Provider  naproxen (NAPROSYN) 375 MG tablet Take 1 tablet (375 mg total) by mouth 2 (two) times daily with a meal. 12/26/22  Yes Krayton Wortley, MD  benzonatate (TESSALON) 100 MG capsule Take 1 capsule (100 mg total) by mouth 3 (three) times daily as needed for cough. 10/28/16   Tomasita Crumble, MD  cephALEXin (KEFLEX) 500 MG capsule Take 1 capsule (500 mg total) by mouth 4 (four) times daily. 03/12/19   Cathren Laine, MD  cyclobenzaprine (FLEXERIL) 5 MG tablet Take 1 tablet (5 mg total) by mouth 3 (three) times daily as needed for muscle spasms. 05/14/21   Charlynne Pander, MD  dicyclomine (BENTYL) 20 MG tablet Take 1 tablet (20 mg total) by mouth 2 (two) times daily. 12/13/22   Melene Plan, DO  ibuprofen (ADVIL) 800 MG tablet Take 1 tablet (800 mg total) by mouth 3 (three) times daily. 05/14/21   Charlynne Pander, MD  methocarbamol (ROBAXIN) 500 MG tablet Take 1 tablet (500 mg total) by mouth 2 (two) times daily. 06/04/15   Joy, Shawn C, PA-C  metroNIDAZOLE (FLAGYL) 500 MG tablet Take 1 tablet (500 mg total) by mouth 2 (two) times daily. 09/02/20    Merrilee Jansky, MD  naproxen (NAPROSYN) 375 MG tablet Take 1 tablet (375 mg total) by mouth 2 (two) times daily. 12/06/17   Maczis, Elmer Sow, PA-C  ondansetron (ZOFRAN-ODT) 4 MG disintegrating tablet 4mg  ODT q4 hours prn nausea/vomit 12/13/22   Melene Plan, DO      Allergies    Patient has no known allergies.    Review of Systems   Review of Systems  Constitutional:  Negative for fever.  HENT:  Negative for facial swelling.   Respiratory:  Negative for wheezing and stridor.   Cardiovascular:  Negative for chest pain.  Gastrointestinal:  Negative for vomiting.  Musculoskeletal:  Positive for arthralgias.  All other systems reviewed and are negative.   Physical Exam Updated Vital Signs BP (!) 140/94 (BP Location: Right Arm)   Pulse 84   Temp 98.5 F (36.9 C) (Oral)   Resp 20   Ht 5\' 4"  (1.626 m)   Wt 106.6 kg   LMP 12/21/2022 (Approximate)   SpO2 98%   BMI 40.34 kg/m  Physical Exam Vitals and nursing note reviewed.  Constitutional:      General: She is not in acute distress.    Appearance: Normal appearance. She is well-developed.  HENT:     Head: Normocephalic and atraumatic.     Nose:  Nose normal.  Eyes:     Pupils: Pupils are equal, round, and reactive to light.  Cardiovascular:     Rate and Rhythm: Normal rate and regular rhythm.     Pulses: Normal pulses.     Heart sounds: Normal heart sounds.  Pulmonary:     Effort: Pulmonary effort is normal. No respiratory distress.     Breath sounds: Normal breath sounds.  Abdominal:     General: Bowel sounds are normal. There is no distension.     Palpations: Abdomen is soft.     Tenderness: There is no abdominal tenderness. There is no guarding or rebound.  Genitourinary:    Vagina: No vaginal discharge.  Musculoskeletal:        General: Normal range of motion.     Cervical back: Neck supple.     Left knee: No swelling, deformity, effusion or crepitus. No LCL laxity, MCL laxity, ACL laxity or PCL laxity.Normal  alignment, normal meniscus and normal patellar mobility.     Instability Tests: Anterior drawer test negative. Posterior drawer test negative. Medial McMurray test negative and lateral McMurray test negative.  Skin:    General: Skin is warm and dry.     Capillary Refill: Capillary refill takes less than 2 seconds.     Findings: No erythema or rash.  Neurological:     General: No focal deficit present.     Mental Status: She is alert.     Deep Tendon Reflexes: Reflexes normal.  Psychiatric:        Mood and Affect: Mood normal.     ED Results / Procedures / Treatments   Labs (all labs ordered are listed, but only abnormal results are displayed) Labs Reviewed - No data to display  EKG None  Radiology DG Knee Complete 4 Views Left  Result Date: 12/26/2022 CLINICAL DATA:  Left knee pain. EXAM: LEFT KNEE - COMPLETE 4+ VIEW COMPARISON:  Left knee radiograph dated 05/14/2021. FINDINGS: There is no acute fracture or dislocation. The bones are well mineralized. No arthritic changes. No joint effusion. The soft tissues are unremarkable. IMPRESSION: Negative. Electronically Signed   By: Elgie Collard M.D.   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    Details: Previous notes reviewed  Radiology: ordered and independent interpretation performed.    Details: Negative by me   Risk OTC drugs. Prescription drug management. Risk Details: Well appearing.  No fractures or dislocations.  Knee is structurually stable will  start Naproxen.  Continue to ice knee.  Stable for discharge.  Strict return .      Final Clinical Impression(s) / ED Diagnoses Final diagnoses:  Acute pain of left knee   Return for intractable cough, coughing up blood, fevers > 100.4 unrelieved by medication, shortness of breath, intractable vomiting, chest pain, shortness of breath, weakness, numbness, changes in speech, facial asymmetry, abdominal pain, passing out, Inability  to tolerate liquids or food, cough, altered mental status or any concerns. No signs of systemic illness or infection. The patient is nontoxic-appearing on exam and vital signs are within normal limits.  I have reviewed the triage vital signs and the nursing notes. Pertinent labs & imaging results that were available during my care of the patient were reviewed by me and considered in my medical decision making (see chart for details). After history, exam, and medical workup I feel the patient has been appropriately medically screened and is safe for discharge home. Pertinent diagnoses were discussed with the patient. Patient was given return precautions.  Rx / DC Orders ED Discharge Orders          Ordered    naproxen (NAPROSYN) 375 MG tablet  2 times daily with meals        12/26/22 0311              Vincenzo Stave, MD 12/26/22 1610

## 2023-06-28 ENCOUNTER — Encounter (HOSPITAL_BASED_OUTPATIENT_CLINIC_OR_DEPARTMENT_OTHER): Payer: Self-pay | Admitting: Urology

## 2023-06-28 ENCOUNTER — Other Ambulatory Visit: Payer: Self-pay

## 2023-06-28 ENCOUNTER — Emergency Department (HOSPITAL_BASED_OUTPATIENT_CLINIC_OR_DEPARTMENT_OTHER)
Admission: EM | Admit: 2023-06-28 | Discharge: 2023-06-28 | Disposition: A | Discharge status: Home / Self Care | Payer: Self-pay | Attending: Emergency Medicine | Admitting: Emergency Medicine

## 2023-06-28 ENCOUNTER — Emergency Department (HOSPITAL_BASED_OUTPATIENT_CLINIC_OR_DEPARTMENT_OTHER): Payer: Self-pay

## 2023-06-28 DIAGNOSIS — R0789 Other chest pain: Secondary | ICD-10-CM | POA: Insufficient documentation

## 2023-06-28 DIAGNOSIS — R051 Acute cough: Secondary | ICD-10-CM

## 2023-06-28 DIAGNOSIS — Z1152 Encounter for screening for COVID-19: Secondary | ICD-10-CM | POA: Insufficient documentation

## 2023-06-28 HISTORY — DX: Unspecified asthma, uncomplicated: J45.909

## 2023-06-28 LAB — RESP PANEL BY RT-PCR (RSV, FLU A&B, COVID)  RVPGX2
Influenza A by PCR: NEGATIVE
Influenza B by PCR: NEGATIVE
Resp Syncytial Virus by PCR: NEGATIVE
SARS Coronavirus 2 by RT PCR: NEGATIVE

## 2023-06-28 MED ORDER — BENZONATATE 100 MG PO CAPS
100.0000 mg | ORAL_CAPSULE | Freq: Once | ORAL | Status: AC
  Administered 2023-06-28: 100 mg via ORAL
  Filled 2023-06-28: qty 1

## 2023-06-28 MED ORDER — AZITHROMYCIN 250 MG PO TABS: 250.0000 mg | ORAL_TABLET | Freq: Every day | ORAL | 0 refills | Status: AC

## 2023-06-28 NOTE — ED Provider Notes (Signed)
[place] [facility] Provider Note   CSN: [id number] Arrival date & time: 06/28/23  1950     History Chief Complaint  Patient presents with   Cough    HPI Caitlyn Jordan is a 27 y.o. female presenting for cough and congestion for 10 days persistently.  History of asthma.  Denies fevers chills nausea vomiting syncope or shortness of breath.  Coughing spells are persistent.  Had all her vaccinations as a child.  Patient's recorded medical, surgical, social, medication list and allergies were reviewed in the Snapshot window as part of the initial history.   Review of Systems   Review of Systems  Constitutional:  Negative for chills and fever.  HENT:  Negative for ear pain and sore throat.   Eyes:  Negative for pain and visual disturbance.  Respiratory:  Positive for cough. Negative for shortness of breath.   Cardiovascular:  Negative for chest pain and palpitations.  Gastrointestinal:  Negative for abdominal pain and vomiting.  Genitourinary:  Negative for dysuria and hematuria.  Musculoskeletal:  Negative for arthralgias and back pain.  Skin:  Negative for color change and rash.  Neurological:  Negative for seizures and syncope.  All other systems reviewed and are negative.   Physical Exam Updated Vital Signs BP 119/83 (BP Location: Left Arm)   Pulse 94   Temp 98.6 F (37 C)   Resp 18   Ht 5\' 4"  (1.626 m)   Wt 106.6 kg   SpO2 98%   BMI 40.34 kg/m  Physical Exam Vitals and nursing note reviewed.  Constitutional:      General: She is not in acute distress.    Appearance: She is well-developed. She is not ill-appearing or toxic-appearing.  HENT:     Head: Normocephalic and atraumatic.  Eyes:     Extraocular Movements: Extraocular movements intact.     Conjunctiva/sclera: Conjunctivae normal.     Pupils: Pupils are equal, round, and reactive to light.  Cardiovascular:     Rate and Rhythm: Normal rate and regular rhythm.      Heart sounds: No murmur heard. Pulmonary:     Effort: Pulmonary effort is normal. No respiratory distress.     Breath sounds: Normal breath sounds.  Abdominal:     General: Abdomen is flat. There is no distension.     Palpations: Abdomen is soft.     Tenderness: There is no abdominal tenderness. There is no right CVA tenderness or left CVA tenderness.  Musculoskeletal:        General: No swelling, tenderness, deformity or signs of injury. Normal range of motion.     Cervical back: Normal range of motion and neck supple. No rigidity.  Skin:    General: Skin is warm and dry.  Neurological:     General: No focal deficit present.     Mental Status: She is alert and oriented to person, place, and time. Mental status is at baseline.     Cranial Nerves: No cranial nerve deficit.  Psychiatric:        Mood and Affect: Mood normal.      ED Course/ Medical Decision Making/ A&P    Procedures Procedures   Medications Ordered in ED Medications - No data to display  Medical Decision Making:   Patient's history of present on his physical exam findings are most consistent with upper respiratory infection.  Initial thought is this is likely viral.  However duration of symptoms will persist past 10  days in the morning. Atypical infection considered as a possible alternative pathology given duration of symptoms and severity of cough as well as the productive nature.  Shared medical decision making with patient, she would like to proceed with azithromycin starting in the morning if symptoms are persistent overnight tonight.  She is stable in no acute distress my x-ray wet read did not demonstrate any large consolidative process warranting more aggressive evaluation in the emergency room though delays in radiology reads means that final radiology read will not be available for multiple hours to come.  Patient does not want a wait in the emergency room for these results. Informed her that she will need  to follow-up with her PCP for results if she leaves the emergency room.  Disposition:  I have considered need for hospitalization, however, considering all of the above, I believe this patient is stable for discharge at this time.  Patient/family educated about specific return precautions for given chief complaint and symptoms.  Patient/family educated about follow-up with PCP.     Patient/family expressed understanding of return precautions and need for follow-up. Patient spoken to regarding all imaging and laboratory results and appropriate follow up for these results. All education provided in verbal form with additional information in written form. Time was allowed for answering of patient questions. Patient discharged.    Emergency Department Medication Summary:   Medications - No data to display      Clinical Impression:  1. Acute cough      Discharge   Final Clinical Impression(s) / ED Diagnoses Final diagnoses:  Acute cough    Rx / DC Orders ED Discharge Orders          Ordered    azithromycin (ZITHROMAX) 250 MG tablet  Daily        06/28/23 2232              Glyn Ade, MD 06/28/23 2233

## 2023-06-28 NOTE — ED Triage Notes (Signed)
Pt states cough x 9 day and states throat sore from cough  States productive cough  Denies fever

## 2023-06-28 NOTE — ED Notes (Signed)
Patient transported to X-ray 

## 2024-09-10 ENCOUNTER — Other Ambulatory Visit: Payer: Self-pay

## 2024-09-10 ENCOUNTER — Emergency Department (HOSPITAL_BASED_OUTPATIENT_CLINIC_OR_DEPARTMENT_OTHER): Payer: Self-pay

## 2024-09-10 ENCOUNTER — Encounter (HOSPITAL_BASED_OUTPATIENT_CLINIC_OR_DEPARTMENT_OTHER): Payer: Self-pay

## 2024-09-10 ENCOUNTER — Emergency Department (HOSPITAL_BASED_OUTPATIENT_CLINIC_OR_DEPARTMENT_OTHER)
Admission: EM | Admit: 2024-09-10 | Discharge: 2024-09-10 | Disposition: A | Discharge status: Home / Self Care | Payer: Self-pay

## 2024-09-10 DIAGNOSIS — M545 Low back pain, unspecified: Secondary | ICD-10-CM | POA: Insufficient documentation

## 2024-09-10 LAB — PREGNANCY, URINE: Preg Test, Ur: NEGATIVE

## 2024-09-10 MED ORDER — METHOCARBAMOL 500 MG PO TABS
500.0000 mg | ORAL_TABLET | Freq: Once | ORAL | Status: AC
  Administered 2024-09-10: 500 mg via ORAL
  Filled 2024-09-10: qty 1

## 2024-09-10 MED ORDER — KETOROLAC TROMETHAMINE 15 MG/ML IJ SOLN
15.0000 mg | Freq: Once | INTRAMUSCULAR | Status: AC
  Administered 2024-09-10: 15 mg via INTRAMUSCULAR
  Filled 2024-09-10: qty 1

## 2024-09-10 MED ORDER — ACETAMINOPHEN 325 MG PO TABS
650.0000 mg | ORAL_TABLET | Freq: Once | ORAL | Status: AC
  Administered 2024-09-10: 650 mg via ORAL
  Filled 2024-09-10: qty 2

## 2024-09-10 MED ORDER — METHOCARBAMOL 500 MG PO TABS: 500.0000 mg | ORAL_TABLET | Freq: Two times a day (BID) | ORAL | 0 refills | Status: AC

## 2024-09-10 MED ORDER — LIDOCAINE 5 % EX PTCH
1.0000 | MEDICATED_PATCH | Freq: Once | CUTANEOUS | Status: DC
  Administered 2024-09-10: 1 via TRANSDERMAL
  Filled 2024-09-10: qty 1

## 2024-09-10 MED ORDER — LIDOCAINE 5 % EX PTCH: 1.0000 | MEDICATED_PATCH | CUTANEOUS | 0 refills | Status: AC

## 2024-09-10 NOTE — ED Provider Notes (Signed)
 " [place] [facility] Provider Note   CSN: [id number] Arrival date & time: 09/10/24  1222     Patient presents with: Back Pain   Caitlyn Jordan is a 29 y.o. female.   29 year old female presenting emergency department for low back pain x 3 days.  Slipped on ice.  Did not hit her head no LOC.  No other injuries.  Took Tylenol  a few days ago which helped her symptoms.  Has been lying in bed and not very active for the past few days.  Worsened today when she went to work.  She is able to ambulate.  No numbness tingling changes in sensation.  No bowel or bladder incontinence.  No saddle anesthesia.  No fevers or chills.   Back Pain      Prior to Admission medications  Medication Sig Start Date End Date Taking? Authorizing Provider  lidocaine  (LIDODERM ) 5 % Place 1 patch onto the skin daily. Remove & Discard patch within 12 hours or as directed by MD 09/10/24  Yes Neysa Caron PARAS, DO  methocarbamol  (ROBAXIN ) 500 MG tablet Take 1 tablet (500 mg total) by mouth 2 (two) times daily for 7 days. 09/10/24 09/17/24 Yes Neysa Caron PARAS, DO  azithromycin  (ZITHROMAX ) 250 MG tablet Take 1 tablet (250 mg total) by mouth daily. Take first 2 tablets together, then 1 every day until finished. 06/28/23   Jerral Meth, MD  benzonatate  (TESSALON ) 100 MG capsule Take 1 capsule (100 mg total) by mouth 3 (three) times daily as needed for cough. 10/28/16   Carlota Day, MD  cephALEXin  (KEFLEX ) 500 MG capsule Take 1 capsule (500 mg total) by mouth 4 (four) times daily. 03/12/19   Steinl, Kevin, MD  cyclobenzaprine  (FLEXERIL ) 5 MG tablet Take 1 tablet (5 mg total) by mouth 3 (three) times daily as needed for muscle spasms. 05/14/21   Patt Alm Macho, MD  dicyclomine  (BENTYL ) 20 MG tablet Take 1 tablet (20 mg total) by mouth 2 (two) times daily. 12/13/22   Emil Share, DO  ibuprofen  (ADVIL ) 800 MG tablet Take 1 tablet (800 mg total) by mouth 3 (three) times daily. 05/14/21   Patt Alm Macho, MD  metroNIDAZOLE  (FLAGYL ) 500 MG tablet Take 1 tablet (500 mg total) by mouth 2 (two) times daily. 09/02/20   Blaise Aleene KIDD, MD  naproxen  (NAPROSYN ) 375 MG tablet Take 1 tablet (375 mg total) by mouth 2 (two) times daily. 12/06/17   Maczis, Michael M, PA-C  naproxen  (NAPROSYN ) 375 MG tablet Take 1 tablet (375 mg total) by mouth 2 (two) times daily with a meal. 12/26/22   Palumbo, April, MD  ondansetron  (ZOFRAN -ODT) 4 MG disintegrating tablet 4mg  ODT q4 hours prn nausea/vomit 12/13/22   Floyd, Dan, DO    Allergies: Patient has no known allergies.    Review of Systems  Musculoskeletal:  Positive for back pain.    Updated Vital Signs BP (!) 165/116 (BP Location: Right Arm)   Pulse 92   Temp 98.1 F (36.7 C)   Resp 18   LMP 09/05/2024   SpO2 99%   Physical Exam Vitals and nursing note reviewed.  Constitutional:      Appearance: She is obese.  HENT:     Nose: Nose normal.     Mouth/Throat:     Mouth: Mucous membranes are moist.  Cardiovascular:     Rate and Rhythm: Normal rate and regular rhythm.  Abdominal:     General: Abdomen is flat.  Musculoskeletal:     Comments: Diffuse tenderness to the musculature of her quadratus lumborum on the left.  Able to ambulate, stand on toes and heels able to squat as well.  Skin:    Capillary Refill: Capillary refill takes less than 2 seconds.  Neurological:     Mental Status: She is alert and oriented to person, place, and time.  Psychiatric:        Mood and Affect: Mood normal.        Behavior: Behavior normal.     (all labs ordered are listed, but only abnormal results are displayed) Labs Reviewed  PREGNANCY, URINE    EKG: None  Radiology: DG Lumbar Spine 2-3 Views Result Date: 09/10/2024 CLINICAL DATA:  Fall on ice, low back pain EXAM: LUMBAR SPINE - 2 VIEW COMPARISON:  None Available. FINDINGS: There is no evidence of lumbar spine fracture. Alignment is normal. Intervertebral disc spaces are maintained.  IMPRESSION: Negative. Electronically Signed   By: Wilkie Lent M.D.   On: 09/10/2024 14:18     Procedures   Medications Ordered in the ED  lidocaine  (LIDODERM ) 5 % 1 patch (1 patch Transdermal Patch Applied 09/10/24 1412)  ketorolac  (TORADOL ) 15 MG/ML injection 15 mg (15 mg Intramuscular Given 09/10/24 1413)  acetaminophen  (TYLENOL ) tablet 650 mg (650 mg Oral Given 09/10/24 1411)  methocarbamol  (ROBAXIN ) tablet 500 mg (500 mg Oral Given 09/10/24 1411)                                    Medical Decision Making 29 year old female presenting emergency department back pain after a fall.  X-ray without fracture.  She has no other red flags.  She seems to be more tender in her musculature rather than midline.  Likely with some form of contusion.  Discussed supportive care.  Given multimodal pain medications here in the emergency department.  Will discharge in stable condition.  Follow-up with PCP.  Amount and/or Complexity of Data Reviewed Labs: ordered. Radiology: ordered.  Risk OTC drugs. Prescription drug management.      Final diagnoses:  Acute left-sided low back pain without sciatica    ED Discharge Orders          Ordered    lidocaine  (LIDODERM ) 5 %  Every 24 hours        09/10/24 1444    methocarbamol  (ROBAXIN ) 500 MG tablet  2 times daily        09/10/24 1444               Neysa Caron PARAS, [place] 09/10/24 1504  "

## 2024-09-10 NOTE — ED Triage Notes (Signed)
 Slipped on ice 3 days ago. L lower back pain since. Denies urinary symptoms
